# Patient Record
Sex: Female | Born: 1985 | Race: White | Hispanic: No | Marital: Married | Smoking: Current every day smoker
Health system: Southern US, Community
[De-identification: ages and names within clinical notes are randomized; demographics above are authoritative.]

## PROBLEM LIST (undated history)

## (undated) DIAGNOSIS — I10 Essential (primary) hypertension: Secondary | ICD-10-CM

---

## 2005-01-15 ENCOUNTER — Inpatient Hospital Stay: Payer: Self-pay | Admitting: Internal Medicine

## 2009-08-23 ENCOUNTER — Ambulatory Visit: Payer: Self-pay | Admitting: Family Medicine

## 2009-10-05 ENCOUNTER — Encounter: Payer: Self-pay | Admitting: Obstetrics & Gynecology

## 2009-10-05 ENCOUNTER — Observation Stay: Payer: Self-pay | Admitting: Obstetrics and Gynecology

## 2009-11-03 ENCOUNTER — Ambulatory Visit: Payer: Self-pay | Admitting: Family Medicine

## 2009-12-05 ENCOUNTER — Observation Stay: Payer: Self-pay

## 2009-12-08 ENCOUNTER — Observation Stay: Payer: Self-pay

## 2010-04-28 ENCOUNTER — Emergency Department: Payer: Self-pay | Admitting: Unknown Physician Specialty

## 2010-12-26 ENCOUNTER — Emergency Department: Payer: Self-pay | Admitting: Emergency Medicine

## 2012-05-22 ENCOUNTER — Encounter: Payer: Self-pay | Admitting: Cardiovascular Disease

## 2012-05-22 ENCOUNTER — Ambulatory Visit (INDEPENDENT_AMBULATORY_CARE_PROVIDER_SITE_OTHER): Payer: Commercial Managed Care - PPO | Admitting: Cardiovascular Disease

## 2012-05-22 VITALS — BP 130/92 | HR 69 | Ht 64.0 in | Wt 217.8 lb

## 2012-05-22 DIAGNOSIS — I10 Essential (primary) hypertension: Secondary | ICD-10-CM

## 2012-05-22 DIAGNOSIS — R0789 Other chest pain: Secondary | ICD-10-CM

## 2012-05-22 DIAGNOSIS — R609 Edema, unspecified: Secondary | ICD-10-CM

## 2012-05-22 DIAGNOSIS — R0602 Shortness of breath: Secondary | ICD-10-CM | POA: Insufficient documentation

## 2012-05-22 MED ORDER — HYDROCHLOROTHIAZIDE 25 MG PO TABS: 25.0000 mg | ORAL_TABLET | Freq: Every day | ORAL | Status: DC

## 2012-05-22 NOTE — Assessment & Plan Note (Signed)
Shortness of breath is chronic. Unable to exclude asthma as symptoms are worse with running. Certainly symptoms could be from deconditioning. Previous normal echocardiogram, low risk of underlying coronary artery disease. We have recommended that she continue her exercise. If symptoms get worse, additional workup could be performed.

## 2012-05-22 NOTE — Patient Instructions (Addendum)
You are doing well. Please try the HCTZ 1/2 or whole pill for swelling, blood pressure, SOB, high blood pressure  Try to quit smoking  The number for [facility]: Dr. Wyn Quaker and Dr. Lorretta Harp  [phone]  Please call us if you have new issues that need to be addressed before your next appt.

## 2012-05-22 NOTE — Assessment & Plan Note (Signed)
Very mild chest tightness with running likely not ischemia. If symptoms get worse, she may benefit from a trial of inhalers. Certainly could be deconditioning. Symptoms are chronic.

## 2012-05-22 NOTE — Assessment & Plan Note (Signed)
Blood pressure is mildly elevated. She does have mild edema as well. We'll start HCTZ 12.5 mg daily, titrating up to 25 mg daily as tolerated.

## 2012-05-22 NOTE — Progress Notes (Signed)
Patient ID: Heidi Nichols, female    DOB: 1986-08-14, 26 y.o.   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   She reports that her chest tightness and shortness of breath is essentially stable and has been chronic. She is concerned because she was told her echocardiogram in 2010 showed dilated heart. During her pregnancy, she had significant leg edema. Ultrasound of her legs showed no DVT and she was sent for echocardiogram at [facility]. She was told it was a problem with her echocardiogram and she was transferred to [facility] for further evaluation. After the delivery of her child, her leg edema resolved but she has continued to have significant varicosities. She reports that her father had varicose veins.   She walks 2 miles a day in an effort to lose weight. She does have mild shortness of breath if she pushes herself too quickly. Also with some chest tightness if she runs. She is able to walk, not able to run long distances. She treats her shortness of breath possibly to smoking from age [age]. She has noticed that her blood pressure has been running high. Also with some edema.  Echocardiogram from 11 2010 is essentially normal with ejection fraction greater than 65%, normal LV size and systolic function, mild LVH, essentially normal valve function, mildly elevated right ventricular systolic pressures  EKG shows normal sinus rhythm with rate 69 beats per minute, no significant ST-T wave changes   Outpatient Encounter Prescriptions as of 05/22/2012  Medication Sig Dispense Refill  . fish oil-omega-3 fatty acids 1000 MG capsule Take 2 g by mouth  daily.      . Multiple Vitamin (MULTIVITAMIN) tablet Take 1 tablet by mouth daily.        Review of Systems  Constitutional: Negative.   HENT: Negative.   Eyes: Negative.   Respiratory: Positive for chest tightness and shortness of breath.   Cardiovascular: Negative.   Gastrointestinal: Negative.   Musculoskeletal: Negative.   Skin: Negative.   Neurological: Negative.   Hematological: Negative.   Psychiatric/Behavioral: Negative.   All other systems reviewed and are negative.    BP 130/92  Pulse 69  Ht 5\' 4"  (1.626 m)  Wt 217 lb 12 oz (98.771 kg)  BMI 37.38 kg/m2  Physical Exam  Nursing note and vitals reviewed. Constitutional: She is oriented to person, place, and time. She appears well-developed and well-nourished.       Obese, numerous varicose veins noted in the lower and upper regions of her legs  HENT:  Head: Normocephalic.  Nose: Nose normal.  Mouth/Throat: Oropharynx is clear and moist.  Eyes: Conjunctivae are normal. Pupils are equal, round, and reactive to light.  Neck: Normal range of motion. Neck supple. No JVD present.  Cardiovascular: Normal rate, regular rhythm, S1 normal, S2 normal, normal heart sounds and intact distal pulses.  Exam reveals no gallop and no friction rub.   No murmur heard. Pulmonary/Chest: Effort normal and breath sounds normal. No respiratory distress. She has no wheezes. She has no rales. She exhibits no tenderness.  Abdominal: Soft. Bowel sounds are normal. She exhibits no distension. There is no tenderness.  Musculoskeletal: Normal range of motion. She  exhibits no edema and no tenderness.  Lymphadenopathy:    She has no cervical adenopathy.  Neurological: She is alert and oriented to person, place, and time. Coordination normal.  Skin: Skin is warm and dry. No rash noted. No erythema.  Psychiatric: She has a normal mood and affect. Her behavior is normal. Judgment and thought content normal.         Assessment and Plan

## 2012-05-22 NOTE — Assessment & Plan Note (Signed)
She has dependent edema, previous echo showing mildly elevated right ventricular systolic pressures though this was during pregnancy. We have recommended she take HCTZ 12.5 mg daily, possibly titrating upwards. We did mention there is a stronger diuretic echo be used if symptoms get worse. We have given her the phone number for the local vascular physicians for her varicosities.

## 2019-06-10 ENCOUNTER — Encounter: Payer: Self-pay | Admitting: Emergency Medicine

## 2019-06-10 ENCOUNTER — Emergency Department
Admission: EM | Admit: 2019-06-10 | Discharge: 2019-06-11 | Disposition: A | Discharge status: Home / Self Care | Payer: Commercial Managed Care - PPO | Attending: Emergency Medicine | Admitting: Emergency Medicine

## 2019-06-10 ENCOUNTER — Other Ambulatory Visit: Payer: Self-pay

## 2019-06-10 ENCOUNTER — Emergency Department: Payer: Commercial Managed Care - PPO

## 2019-06-10 DIAGNOSIS — N390 Urinary tract infection, site not specified: Secondary | ICD-10-CM | POA: Diagnosis not present

## 2019-06-10 DIAGNOSIS — R51 Headache: Secondary | ICD-10-CM | POA: Diagnosis not present

## 2019-06-10 DIAGNOSIS — Z79899 Other long term (current) drug therapy: Secondary | ICD-10-CM | POA: Insufficient documentation

## 2019-06-10 DIAGNOSIS — F17210 Nicotine dependence, cigarettes, uncomplicated: Secondary | ICD-10-CM | POA: Insufficient documentation

## 2019-06-10 DIAGNOSIS — I10 Essential (primary) hypertension: Secondary | ICD-10-CM | POA: Diagnosis not present

## 2019-06-10 LAB — CBC WITH DIFFERENTIAL/PLATELET
Abs Immature Granulocytes: 0.03 10*3/uL (ref 0.00–0.07)
Basophils Absolute: 0 10*3/uL (ref 0.0–0.1)
Basophils Relative: 0 %
Eosinophils Absolute: 0.1 10*3/uL (ref 0.0–0.5)
Eosinophils Relative: 2 %
HCT: 41.6 % (ref 36.0–46.0)
Hemoglobin: 14.5 g/dL (ref 12.0–15.0)
Immature Granulocytes: 0 %
Lymphocytes Relative: 28 %
Lymphs Abs: 2.1 10*3/uL (ref 0.7–4.0)
MCH: 33 pg (ref 26.0–34.0)
MCHC: 34.9 g/dL (ref 30.0–36.0)
MCV: 94.5 fL (ref 80.0–100.0)
Monocytes Absolute: 0.5 10*3/uL (ref 0.1–1.0)
Monocytes Relative: 6 %
Neutro Abs: 4.8 10*3/uL (ref 1.7–7.7)
Neutrophils Relative %: 64 %
Platelets: 158 10*3/uL (ref 150–400)
RBC: 4.4 MIL/uL (ref 3.87–5.11)
RDW: 11.7 % (ref 11.5–15.5)
WBC: 7.5 10*3/uL (ref 4.0–10.5)
nRBC: 0 % (ref 0.0–0.2)

## 2019-06-10 NOTE — ED Notes (Signed)
Patient went to [place] for BP issues. [facility] advised her to come to ED due to BP systolic being over 102. Patient states was having some left sided pressure rate 5/10 and having a headache 5/10 in pain. Denies SOB or other symptoms. Pt denies any medications for BP.

## 2019-06-10 NOTE — ED Triage Notes (Signed)
Pt sent from urgent care for HTN.   Denies pain in chest or SHOB.  Has had some blurry vision in mornings at time but denies now.  Started abx for UTI that was diagnosed with video appointment with doctor.

## 2019-06-10 NOTE — ED Provider Notes (Signed)
[facility] Emergency Department Provider Note   ____________________________________________   First MD Initiated Contact with Patient 06/10/19 2316     (approximate)  I have reviewed the triage vital signs and the nursing notes.   HISTORY  Chief Complaint Hypertension    HPI Heidi Nichols is a 33 y.o. female referred from urgent care for high blood pressure.  Patient has a history of high blood pressure who used to take HCTZ 12.5 mg several years ago. This week patient has been having lower back pain and dysuria.  Intermittent blurry vision, headache and chest discomfort.  2 days ago she had a telemedicine visit and diagnosed with UTI and started on Macrobid.  Went to urgent care today and referred to the ED secondary to high blood pressure.  Patient denies fever, cough, shortness of breath, abdominal pain, nausea, vomiting.  Denies recent trauma, travel or exposure to persons diagnosed with coronavirus.      Past medical history None  Patient Active Problem List   Diagnosis Date Noted  . Chest pressure 05/22/2012  . Shortness of breath 05/22/2012  . Edema 05/22/2012  . Hypertension 05/22/2012    History reviewed. No pertinent surgical history.  Prior to Admission medications   Medication Sig Start Date End Date Taking? Authorizing Provider  fish oil-omega-3 fatty acids 1000 MG capsule Take 2 g by mouth daily.    [provider]  Multiple Vitamin (MULTIVITAMIN) tablet Take 1 tablet by mouth daily.    [provider]  nitrofurantoin, macrocrystal-monohydrate, (MACROBID) 100 MG capsule Take 100 mg by mouth 2 (two) times a day. 06/09/19 06/13/19  [provider]    Allergies Patient has no known allergies.  Family History  Problem Relation Age of Onset  . Hypertension Mother   . Asthma Mother   . Heart attack Mother   . Fainting Paternal Grandfather     Social History Social History   Tobacco Use  .  Smoking status: Current Every Day Smoker    Packs/day: 0.50    Years: 8.00    Pack years: 4.00    Types: Cigarettes  . Smokeless tobacco: Never Used  Substance Use Topics  . Alcohol use: Yes    Alcohol/week: 2.0 standard drinks    Types: 2 Standard drinks or equivalent per week  . Drug use: No    Review of Systems  Constitutional: No fever/chills Eyes: No visual changes. ENT: No sore throat. Cardiovascular: Positive chest pain. Respiratory: Denies shortness of breath. Gastrointestinal: No abdominal pain.  No nausea, no vomiting.  No diarrhea.  No constipation. Genitourinary: Positive for dysuria. Musculoskeletal: Negative for back pain. Skin: Negative for rash. Neurological: Positive for headache. Negative for focal weakness or numbness.   ____________________________________________   PHYSICAL EXAM:  VITAL SIGNS: ED Triage Vitals  Enc Vitals Group     BP 06/10/19 1818 (!) 213/127     Pulse Rate 06/10/19 1816 92     Resp 06/10/19 1816 18     Temp 06/10/19 1816 98.6 F (37 C)     Temp Source 06/10/19 1816 Oral     SpO2 06/10/19 1816 96 %     Weight 06/10/19 1817 252 lb (114.3 kg)     Height 06/10/19 1817 5\' 4"  (1.626 m)     Head Circumference --      Peak Flow --      Pain Score 06/10/19 1816 4     Pain Loc --      Pain  Edu? --      Excl. in [place]? --     Constitutional: Alert and oriented. Well appearing and in no acute distress. Eyes: Conjunctivae are normal. PERRL. EOMI. Head: Atraumatic. Nose: No congestion/rhinnorhea. Mouth/Throat: Mucous membranes are moist.  Oropharynx non-erythematous. Neck: No stridor.  No carotid bruits.  Supple neck without meningismus. Cardiovascular: Normal rate, regular rhythm. Grossly normal heart sounds.  Good peripheral circulation. Respiratory: Normal respiratory effort.  No retractions. Lungs CTAB. Gastrointestinal: Soft and nontender. No distention. No abdominal bruits. No CVA tenderness. Musculoskeletal: No lower extremity  tenderness.  2+ BLE nonpitting edema.  No joint effusions. Neurologic:  Normal speech and language. No gross focal neurologic deficits are appreciated. No gait instability. Skin:  Skin is warm, dry and intact. No rash noted. Psychiatric: Mood and affect are normal. Speech and behavior are normal.  ____________________________________________   LABS (all labs ordered are listed, but only abnormal results are displayed)  Labs Reviewed  COMPREHENSIVE METABOLIC PANEL - Abnormal; Notable for the following components:      Result Value   Sodium 132 (*)    Glucose, Bld 105 (*)    AST 56 (*)    ALT 67 (*)    Anion gap 3 (*)    All other components within normal limits  URINALYSIS, COMPLETE (UACMP) WITH MICROSCOPIC - Abnormal; Notable for the following components:   Color, Urine YELLOW (*)    APPearance HAZY (*)    Leukocytes,Ua LARGE (*)    WBC, UA >50 (*)    Bacteria, UA RARE (*)    Non Squamous Epithelial PRESENT (*)    All other components within normal limits  URINE CULTURE  CBC WITH DIFFERENTIAL/PLATELET  TROPONIN I  POCT PREGNANCY, URINE  POC URINE PREG, ED   ____________________________________________  EKG  ED ECG REPORT I, Ekam Besson J, the attending physician, personally viewed and interpreted this ECG.   Date: 06/10/2019  EKG Time: 2324  Rate: 74  Rhythm: normal EKG, normal sinus rhythm  Axis: Normal  Intervals:none  ST&T Change: Nonspecific  ____________________________________________  RADIOLOGY  ED MD interpretation: No acute cardiopulmonary process  Official radiology report(s): Ct Head Wo Contrast  Result Date: 06/11/2019 CLINICAL DATA:  33 year old female with hypertension. Headache, blurred vision. EXAM: CT HEAD WITHOUT CONTRAST TECHNIQUE: Contiguous axial images were obtained from the base of the skull through the vertex without intravenous contrast. COMPARISON:  None. FINDINGS: Brain: Normal cerebral volume. No midline shift, ventriculomegaly, mass  effect, evidence of mass lesion, intracranial hemorrhage or evidence of cortically based acute infarction. There is a small oval hypodense area in the posterior left hemisphere white matter on series 3, image 17 and coronal image 45. I favor this is a perivascular space (normal variant). However, there is also mild heterogeneity of the right thalamus on series 3, image 13. Elsewhere gray-white matter differentiation is within normal limits throughout the brain. No cortical encephalomalacia. Vascular: No suspicious intracranial vascular hyperdensity. Skull: Negative. Sinuses/Orbits: Visualized paranasal sinuses and mastoids are clear. Other: Visualized orbits and scalp soft tissues are within normal limits. IMPRESSION: 1. Difficult to exclude small vessel disease in the brain, although small hypodense areas in the left parietal lobe white matter and the right thalamus might simply reflect perivascular spaces (normal variant). 2. Otherwise normal noncontrast head CT. Electronically Signed   By: Genevie Ann M.D.   On: 06/11/2019 00:53   Dg Chest Port 1 View  Result Date: 06/10/2019 CLINICAL DATA:  33 year old female with hypertension. Headache, left side pressure. EXAM: PORTABLE  CHEST 1 VIEW COMPARISON:  Chest radiographs 12/26/2010. FINDINGS: Portable AP upright view at 2321 hours. Lower lung volumes. Normal cardiac size and mediastinal contours. Visualized tracheal air column is within normal limits. Allowing for portable technique the lungs are clear. No osseous abnormality identified. IMPRESSION: Lower lung volumes but otherwise negative portable chest. Electronically Signed   By: Odessa FlemingH  Hall M.D.   On: 06/10/2019 23:38    ____________________________________________   PROCEDURES  Procedure(s) performed (including Critical Care):  Procedures   ____________________________________________   INITIAL IMPRESSION / ASSESSMENT AND PLAN / ED COURSE  As part of my medical decision making, I reviewed the  following data within the electronic MEDICAL RECORD NUMBER Nursing notes reviewed and incorporated, Labs reviewed, EKG interpreted, Old chart reviewed, Radiograph reviewed and Notes from prior ED visits     Heidi Nichols was evaluated in Emergency Department on 06/11/2019 for the symptoms described in the history of present illness. She was evaluated in the context of the global COVID-19 pandemic, which necessitated consideration that the patient might be at risk for infection with the SARS-CoV-2 virus that causes COVID-19. Institutional protocols and algorithms that pertain to the evaluation of patients at risk for COVID-19 are in a state of rapid change based on information released by regulatory bodies including the CDC and federal and state organizations. These policies and algorithms were followed during the patient's care in the ED.   33 year old female with history of hypertension currently not on medications who presents for elevated blood pressure in the setting of UTI.  Will check basic lab work and imaging studies.  Administer clonidine and reassess.  Clinical Course as of Jun 10 358  Fri Jun 11, 2019  0105 UA noted. Will start Rocephin and change Macrobid to Keflex.   [JS]  0224 No improvement in blood pressure after clonidine.  Will try IV hydralazine.  Updated patient on imaging results.   [JS]  0357 BP 122/94. Patient feeling better and eager for discharge home. Will restart hctz, prescribe Keflex and she will follow up closely with her PCP. Strict return precautions given. Patient verbalizes understanding and agrees with plan of care.   [JS]    Clinical Course User Index [JS] Irean HongSung, Ziyon Soltau J, MD     ____________________________________________   FINAL CLINICAL IMPRESSION(S) / ED DIAGNOSES  Final diagnoses:  Essential hypertension  Urinary tract infection without hematuria, site unspecified     ED Discharge Orders    None       Note:  This document was prepared  using Dragon voice recognition software and may include unintentional dictation errors.   Irean HongSung, Thai Burgueno J, MD 06/12/19 2330

## 2019-06-10 NOTE — ED Triage Notes (Signed)
Discussed with dr Archie Balboa. No protocols at this time.

## 2019-06-11 ENCOUNTER — Emergency Department: Payer: Commercial Managed Care - PPO

## 2019-06-11 LAB — URINALYSIS, COMPLETE (UACMP) WITH MICROSCOPIC
Bilirubin Urine: NEGATIVE
Glucose, UA: NEGATIVE mg/dL
Hgb urine dipstick: NEGATIVE
Ketones, ur: NEGATIVE mg/dL
Nitrite: NEGATIVE
Protein, ur: NEGATIVE mg/dL
Specific Gravity, Urine: 1.023 (ref 1.005–1.030)
WBC, UA: >50 WBC/hpf — ABNORMAL HIGH (ref 0–5)
pH: 5 (ref 5.0–8.0)

## 2019-06-11 LAB — COMPREHENSIVE METABOLIC PANEL
ALT: 67 U/L — ABNORMAL HIGH (ref 0–44)
AST: 56 U/L — ABNORMAL HIGH (ref 15–41)
Albumin: 3.8 g/dL (ref 3.5–5.0)
Alkaline Phosphatase: 78 U/L (ref 38–126)
Anion gap: 3 — ABNORMAL LOW (ref 5–15)
BUN: 15 mg/dL (ref 6–20)
CO2: 26 mmol/L (ref 22–32)
Calcium: 8.9 mg/dL (ref 8.9–10.3)
Chloride: 103 mmol/L (ref 98–111)
Creatinine, Ser: 0.62 mg/dL (ref 0.44–1.00)
GFR calc Af Amer: >60 mL/min (ref >60)
GFR calc non Af Amer: >60 mL/min (ref >60)
Glucose, Bld: 105 mg/dL — ABNORMAL HIGH (ref 70–99)
Potassium: 3.7 mmol/L (ref 3.5–5.1)
Sodium: 132 mmol/L — ABNORMAL LOW (ref 135–145)
Total Bilirubin: 1 mg/dL (ref 0.3–1.2)
Total Protein: 7.7 g/dL (ref 6.5–8.1)

## 2019-06-11 LAB — POCT PREGNANCY, URINE: Preg Test, Ur: NEGATIVE

## 2019-06-11 LAB — TROPONIN I: Troponin I: <0.03 ng/mL (ref <0.03)

## 2019-06-11 MED ORDER — HYDROCHLOROTHIAZIDE 25 MG PO TABS: 25.0000 mg | ORAL_TABLET | Freq: Every day | ORAL | 0 refills | Status: DC

## 2019-06-11 MED ORDER — SODIUM CHLORIDE 0.9 % IV SOLN
1.0000 g | Freq: Once | INTRAVENOUS | Status: AC
  Administered 2019-06-11: 02:00:00 1 g via INTRAVENOUS
  Filled 2019-06-11: qty 10

## 2019-06-11 MED ORDER — CEPHALEXIN 500 MG PO CAPS: 500.0000 mg | ORAL_CAPSULE | Freq: Three times a day (TID) | ORAL | 0 refills | Status: DC

## 2019-06-11 MED ORDER — CLONIDINE HCL 0.1 MG PO TABS
0.1000 mg | ORAL_TABLET | Freq: Once | ORAL | Status: AC
  Administered 2019-06-11: 0.1 mg via ORAL
  Filled 2019-06-11: qty 1

## 2019-06-11 MED ORDER — HYDRALAZINE HCL 20 MG/ML IJ SOLN
10.0000 mg | Freq: Once | INTRAMUSCULAR | Status: AC
  Administered 2019-06-11: 03:00:00 10 mg via INTRAVENOUS
  Filled 2019-06-11: qty 1

## 2019-06-11 NOTE — Discharge Instructions (Signed)
1.  Take antibiotic as prescribed (Keflex 500 mg 3 times daily x7 days). 2.  Restart HCTZ 25 mg daily. 3.  Return to the ER for worsening symptoms, persistent vomiting, difficulty breathing or other concerns.

## 2019-06-13 LAB — URINE CULTURE: Culture: >=100000 — AB

## 2020-03-03 ENCOUNTER — Other Ambulatory Visit: Payer: Self-pay

## 2020-03-03 ENCOUNTER — Emergency Department: Payer: Commercial Managed Care - PPO

## 2020-03-03 ENCOUNTER — Emergency Department
Admission: EM | Admit: 2020-03-03 | Discharge: 2020-03-03 | Disposition: A | Discharge status: Home / Self Care | Payer: Commercial Managed Care - PPO | Attending: Emergency Medicine | Admitting: Emergency Medicine

## 2020-03-03 DIAGNOSIS — I83812 Varicose veins of left lower extremities with pain: Secondary | ICD-10-CM | POA: Diagnosis not present

## 2020-03-03 DIAGNOSIS — F17210 Nicotine dependence, cigarettes, uncomplicated: Secondary | ICD-10-CM | POA: Diagnosis not present

## 2020-03-03 DIAGNOSIS — I10 Essential (primary) hypertension: Secondary | ICD-10-CM | POA: Diagnosis not present

## 2020-03-03 DIAGNOSIS — M79605 Pain in left leg: Secondary | ICD-10-CM

## 2020-03-03 DIAGNOSIS — Z79899 Other long term (current) drug therapy: Secondary | ICD-10-CM | POA: Insufficient documentation

## 2020-03-03 DIAGNOSIS — I8312 Varicose veins of left lower extremity with inflammation: Secondary | ICD-10-CM

## 2020-03-03 DIAGNOSIS — I8392 Asymptomatic varicose veins of left lower extremity: Secondary | ICD-10-CM

## 2020-03-03 DIAGNOSIS — R0789 Other chest pain: Secondary | ICD-10-CM | POA: Diagnosis present

## 2020-03-03 HISTORY — DX: Essential (primary) hypertension: I10

## 2020-03-03 LAB — CBC
HCT: 43.5 % (ref 36.0–46.0)
Hemoglobin: 15 g/dL (ref 12.0–15.0)
MCH: 33 pg (ref 26.0–34.0)
MCHC: 34.5 g/dL (ref 30.0–36.0)
MCV: 95.6 fL (ref 80.0–100.0)
Platelets: 152 10*3/uL (ref 150–400)
RBC: 4.55 MIL/uL (ref 3.87–5.11)
RDW: 11.9 % (ref 11.5–15.5)
WBC: 7.8 10*3/uL (ref 4.0–10.5)
nRBC: 0 % (ref 0.0–0.2)

## 2020-03-03 LAB — BASIC METABOLIC PANEL
Anion gap: 9 (ref 5–15)
BUN: 18 mg/dL (ref 6–20)
CO2: 26 mmol/L (ref 22–32)
Calcium: 9.7 mg/dL (ref 8.9–10.3)
Chloride: 100 mmol/L (ref 98–111)
Creatinine, Ser: 0.66 mg/dL (ref 0.44–1.00)
GFR calc Af Amer: >60 mL/min (ref >60)
GFR calc non Af Amer: >60 mL/min (ref >60)
Glucose, Bld: 124 mg/dL — ABNORMAL HIGH (ref 70–99)
Potassium: 3.6 mmol/L (ref 3.5–5.1)
Sodium: 135 mmol/L (ref 135–145)

## 2020-03-03 LAB — TROPONIN I (HIGH SENSITIVITY)
Troponin I (High Sensitivity): 9 ng/L (ref <18)
Troponin I (High Sensitivity): 8 ng/L (ref <18)

## 2020-03-03 MED ORDER — AMLODIPINE BESYLATE 10 MG PO TABS: 10.0000 mg | ORAL_TABLET | Freq: Every day | ORAL | 0 refills | Status: DC

## 2020-03-03 MED ORDER — SODIUM CHLORIDE 0.9% FLUSH: 3.0000 mL | Freq: Once | INTRAVENOUS | Status: DC

## 2020-03-03 MED ORDER — AMLODIPINE BESYLATE 5 MG PO TABS
10.0000 mg | ORAL_TABLET | Freq: Once | ORAL | Status: AC
  Administered 2020-03-03: 10 mg via ORAL
  Filled 2020-03-03: qty 2

## 2020-03-03 MED ORDER — LABETALOL HCL 5 MG/ML IV SOLN
20.0000 mg | Freq: Once | INTRAVENOUS | Status: AC
  Administered 2020-03-03: 20 mg via INTRAVENOUS
  Filled 2020-03-03: qty 4

## 2020-03-03 MED ORDER — METOPROLOL TARTRATE 25 MG PO TABS: 25.0000 mg | ORAL_TABLET | Freq: Two times a day (BID) | ORAL | 0 refills | Status: DC

## 2020-03-03 NOTE — Discharge Instructions (Addendum)
Your ultrasound confirms that the swelling on your left thigh is due to clotted varicose vein.  They do not see any clots in the large central veins of your leg.  This can be managed with a heating pad and anti-inflammatory medicine such as ibuprofen or naproxen.  Please take amlodipine and metoprolol as prescribed to help control blood pressure, and follow-up with primary care in 1 to 2 weeks for reassessment and adjustment of medications as needed.

## 2020-03-03 NOTE — ED Provider Notes (Signed)
[facility] Emergency Department Provider Note  ____________________________________________  Time seen: Approximately 5:52 PM  I have reviewed the triage vital signs and the nursing notes.   HISTORY  Chief Complaint varicose vein swelling and inflammation    HPI Heidi Nichols is a 34 y.o. 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  Patient also notes a swollen area to the left thigh.  Denies any history of DVT or trauma to the area.  Denies fevers chills body aches.      Past Medical History:  Diagnosis Date  . Hypertension      Patient Active Problem List   Diagnosis Date Noted  . Chest pressure 05/22/2012  . Shortness of breath 05/22/2012  . Edema 05/22/2012  . Hypertension 05/22/2012     History reviewed. No pertinent surgical history.   Prior to Admission medications   Medication Sig Start Date End Date Taking? Authorizing Provider  amLODipine (NORVASC) 10 MG tablet Take 1 tablet (10 mg total) by mouth daily. 03/03/20 03/03/21  Carrie Mew, MD  cephALEXin (KEFLEX) 500 MG capsule Take 1 capsule (500 mg total) by mouth 3 (three) times daily. 06/11/19   Paulette Blanch, MD  fish oil-omega-3 fatty acids 1000 MG capsule Take 2 g by mouth daily.    [provider]  hydrochlorothiazide (HYDRODIURIL) 25 MG tablet Take 1 tablet (25 mg total) by mouth daily. 06/11/19   Paulette Blanch, MD  metoprolol tartrate (LOPRESSOR) 25 MG tablet Take 1 tablet (25 mg total) by mouth 2 (two) times daily. 03/03/20 03/03/21  Carrie Mew, MD  Multiple Vitamin (MULTIVITAMIN) tablet Take 1 tablet by mouth daily.    [provider]     Allergies Patient has no known  allergies.   Family History  Problem Relation Age of Onset  . Hypertension Mother   . Asthma Mother   . Heart attack Mother   . Fainting Paternal Grandfather     Social History Social History   Tobacco Use  . Smoking status: Current Every Day Smoker    Packs/day: 0.50    Years: 8.00    Pack years: 4.00    Types: Cigarettes  . Smokeless tobacco: Never Used  Substance Use Topics  . Alcohol use: Yes    Alcohol/week: 2.0 standard drinks    Types: 2 Standard drinks or equivalent per week    Comment: social  . Drug use: No    Review of Systems  Constitutional:   No fever or chills.  ENT:   No sore throat. No rhinorrhea. Cardiovascular: Positive chest pain as above without syncope. Respiratory:   No dyspnea or cough. Gastrointestinal:   Negative for abdominal pain, vomiting and diarrhea.  Musculoskeletal:   Positive left thigh swelling as above All other systems reviewed and are negative except as documented above in ROS and HPI.  ____________________________________________   PHYSICAL EXAM:  VITAL SIGNS: ED Triage Vitals  Enc Vitals Group     BP 03/03/20 1621 (!) 232/143     Pulse Rate 03/03/20 1621 (!) 105     Resp 03/03/20 1621 18     Temp 03/03/20 1621 97.8 F (36.6 C)     Temp Source 03/03/20 1621 Oral     SpO2  03/03/20 1621 97 %     Weight 03/03/20 1626 245 lb (111.1 kg)     Height 03/03/20 1626 5\' 4"  (1.626 m)     Head Circumference --      Peak Flow --      Pain Score 03/03/20 1626 0     Pain Loc --      Pain Edu? --      Excl. in GC? --     Vital signs reviewed, nursing assessments reviewed.   Constitutional:   Alert and oriented. Non-toxic appearance. Eyes:   Conjunctivae are normal. EOMI. PERRL. ENT      Head:   Normocephalic and atraumatic.      Nose:   Wearing a mask.      Mouth/Throat:   Wearing a mask.      Neck:   No meningismus. Full ROM. Hematological/Lymphatic/Immunilogical:   No cervical lymphadenopathy. Cardiovascular:   RRR.  Symmetric bilateral radial and DP pulses.  No murmurs. Cap refill less than 2 seconds. Respiratory:   Normal respiratory effort without tachypnea/retractions. Breath sounds are clear and equal bilaterally. No wheezes/rales/rhonchi. Gastrointestinal:   Soft and nontender. Non distended. There is no CVA tenderness.  No rebound, rigidity, or guarding.  Musculoskeletal:   Normal range of motion in all extremities. No joint effusions.  No lower extremity tenderness.  No edema.  Symmetric calf circumference.  Prominent extensive varicose veins.  Left medial mid thigh has an approximately 4 cm firm nodule in the subcutaneous space which is nonmobile, not fluctuant, not tender, not warm, no streaking, no crepitus.  No wounds. Neurologic:   Normal speech and language.  Motor grossly intact. No acute focal neurologic deficits are appreciated.  Skin:    Skin is warm, dry and intact. No rash noted.  No petechiae, purpura, or bullae.  ____________________________________________    LABS (pertinent positives/negatives) (all labs ordered are listed, but only abnormal results are displayed) Labs Reviewed  BASIC METABOLIC PANEL - Abnormal; Notable for the following components:      Result Value   Glucose, Bld 124 (*)    All other components within normal limits  CBC  POC URINE PREG, ED  TROPONIN I (HIGH SENSITIVITY)  TROPONIN I (HIGH SENSITIVITY)   ____________________________________________   EKG  Interpreted by me  Date: 03/03/2020  Rate: 93  Rhythm: normal sinus rhythm  QRS Axis: normal  Intervals: normal  ST/T Wave abnormalities: normal  Conduction Disutrbances: none  Narrative Interpretation: unremarkable      ____________________________________________    RADIOLOGY  DG Chest 2 View  Result Date: 03/03/2020 CLINICAL DATA:  Chest pain. EXAM: CHEST - 2 VIEW COMPARISON:  Chest radiograph 06/10/2019, chest radiograph 12/26/2010 FINDINGS: Heart size within normal limits. There is  no airspace consolidation within the lungs. No evidence of pleural effusion or pneumothorax. No acute bony abnormality. IMPRESSION: No evidence of acute cardiopulmonary abnormality. Electronically Signed   By: 12/28/2010 DO   On: 03/03/2020 17:02   05/03/2020 Venous Img Lower Unilateral Left (DVT)  Result Date: 03/03/2020 CLINICAL DATA:  34 year old female with left leg pain and swelling. EXAM: Left LOWER EXTREMITY VENOUS DOPPLER ULTRASOUND TECHNIQUE: Gray-scale sonography with compression, as well as color and duplex ultrasound, were performed to evaluate the deep venous system(s) from the level of the common femoral vein through the popliteal and proximal calf veins. COMPARISON:  None. FINDINGS: VENOUS Normal compressibility of the common femoral, superficial femoral, and popliteal veins, as well as the visualized calf veins. Visualized portions of  profunda femoral vein and great saphenous vein unremarkable. No filling defects to suggest DVT on grayscale or color Doppler imaging. Doppler waveforms show normal direction of venous flow, normal respiratory phasicity and response to augmentation. Limited views of the contralateral common femoral vein are unremarkable. OTHER None. Limitations: There is a large thrombosed varices in the medial aspect of the distal thigh corresponding to the area of swelling and redness. IMPRESSION: 1. No femoropopliteal DVT nor evidence of DVT within the visualized calf veins. 2. Large thrombosed superficial varices. If clinical symptoms are inconsistent or if there are persistent or worsening symptoms, further imaging (possibly involving the iliac veins) may be warranted. Electronically Signed   By: Elgie Collard M.D.   On: 03/03/2020 19:03    ____________________________________________   PROCEDURES Procedures  ____________________________________________  DIFFERENTIAL DIAGNOSIS   DVT, thrombosed varicosity, lipoma, non-STEMI  CLINICAL IMPRESSION / ASSESSMENT AND PLAN /  ED COURSE  Medications ordered in the ED: Medications  sodium chloride flush (NS) 0.9 % injection 3 mL (3 mLs Intravenous Not Given 03/03/20 1732)  labetalol (NORMODYNE) injection 20 mg (20 mg Intravenous Given 03/03/20 1800)  amLODipine (NORVASC) tablet 10 mg (10 mg Oral Given 03/03/20 1928)    Pertinent labs & imaging results that were available during my care of the patient were reviewed by me and considered in my medical decision making (see chart for details).  Heidi Nichols was evaluated in Emergency Department on 03/03/2020 for the symptoms described in the history of present illness. She was evaluated in the context of the global COVID-19 pandemic, which necessitated consideration that the patient might be at risk for infection with the SARS-CoV-2 virus that causes COVID-19. Institutional protocols and algorithms that pertain to the evaluation of patients at risk for COVID-19 are in a state of rapid change based on information released by regulatory bodies including the CDC and federal and state organizations. These policies and algorithms were followed during the patient's care in the ED.   Patient presents with nonspecific chest pain in the setting of uncontrolled hypertension. Considering the patient's symptoms, medical history, and physical examination today, I have low suspicion for ACS, PE, TAD, pneumothorax, carditis, mediastinitis, pneumonia, CHF, or sepsis.  We will check serial troponins, give IV labetalol for blood pressure control.  Ultrasound left lower extremity to evaluate for DVT.  Not consistent with skin or soft tissue infection such as abscess osteomyelitis necrotizing fasciitis or cellulitis.    ----------------------------------------- 8:04 PM on 03/03/2020 -----------------------------------------  Blood pressure improved.  Serial troponins negative.  Ultrasound negative for DVT and does confirm that the area of swelling is a thrombosis of a varicose vein.  NSAIDs,  heat therapy, blood pressure management with new prescriptions for amlodipine and metoprolol, follow-up with primary care.     ____________________________________________   FINAL CLINICAL IMPRESSION(S) / ED DIAGNOSES    Final diagnoses:  Hypertension, unspecified type  Varicose veins of left lower extremity, unspecified whether complicated     ED Discharge Orders         Ordered    amLODipine (NORVASC) 10 MG tablet  Daily     03/03/20 2001    metoprolol tartrate (LOPRESSOR) 25 MG tablet  2 times daily     03/03/20 2001          Portions of this note were generated with dragon dictation software. Dictation errors may occur despite best attempts at proofreading.   Sharman Cheek, MD 03/03/20 2005

## 2020-03-03 NOTE — ED Notes (Addendum)
This RN bedside, trop drawn per EDP order. Pt resting comfortably, denies pain, denies any needs currently. Call bell within reach.

## 2020-03-03 NOTE — ED Triage Notes (Signed)
Pt to the er for pain behind the left knee and chest pain. Pt has a indurated area on the medial posterior left knee with a hx of varicose veins. No hx of DVT or blood thinners. Pt is off her HCTZ and is very hypertensive in triage.

## 2020-03-03 NOTE — ED Notes (Signed)
US at bedside at this time 

## 2020-03-03 NOTE — ED Triage Notes (Addendum)
FIRST NURSE NOTE- pt here for chest pain. Ambulatory, declined wheel chair.  NAD at this time. Also c/o varicose vein with knot in it.

## 2020-12-27 IMAGING — CT CT HEAD WITHOUT CONTRAST
3 series · 15 of 46 positions shown, 18 images · non-contrast
Comparison: None.

CLINICAL DATA: 32-year-old female with hypertension. Headache,
blurred vision.

EXAM:
CT HEAD WITHOUT CONTRAST
TECHNIQUE: Contiguous axial images were obtained from the base of the skull
through the vertex without intravenous contrast.

[Series 3: head wo · axial · 0.44mm/px · z∈[-109,+11]mm · 9 of 29 slices shown, 12 images]
[im 3/29  brain]
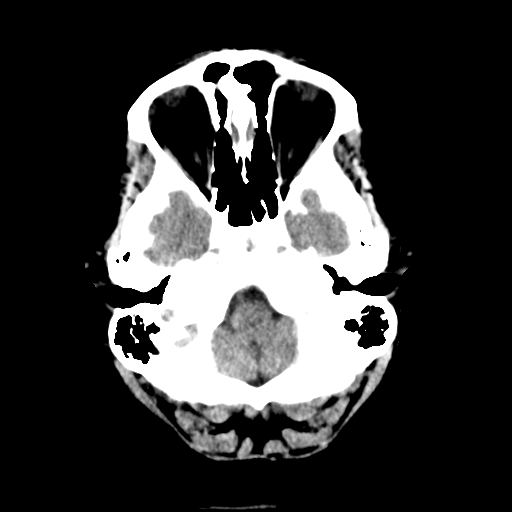
[im 3/29  bone]
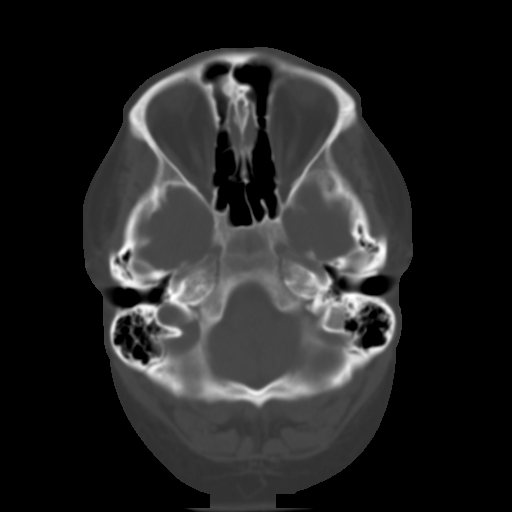
[im 6/29  brain]
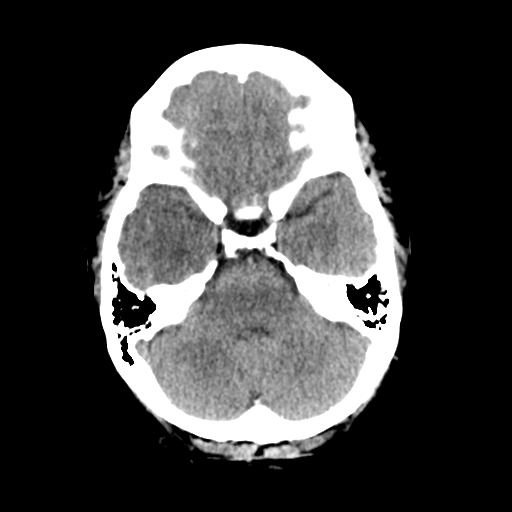
[im 9/29  brain]
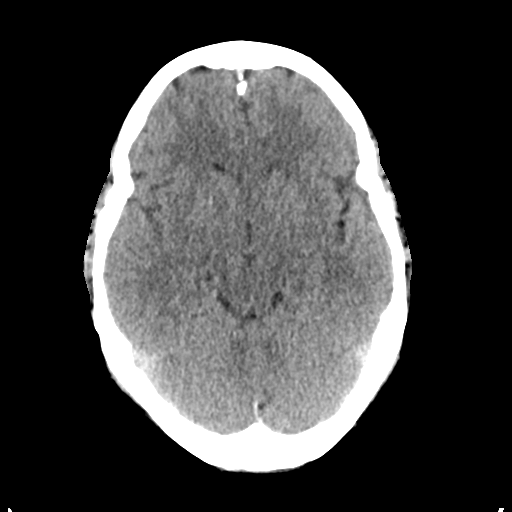
[im 12/29  brain]
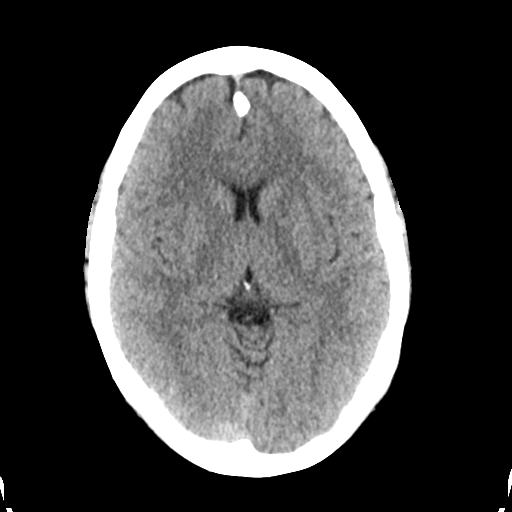
[im 15/29  brain]
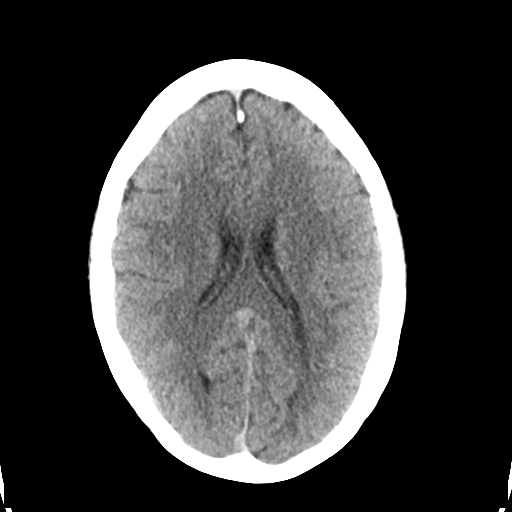
[im 15/29  bone]
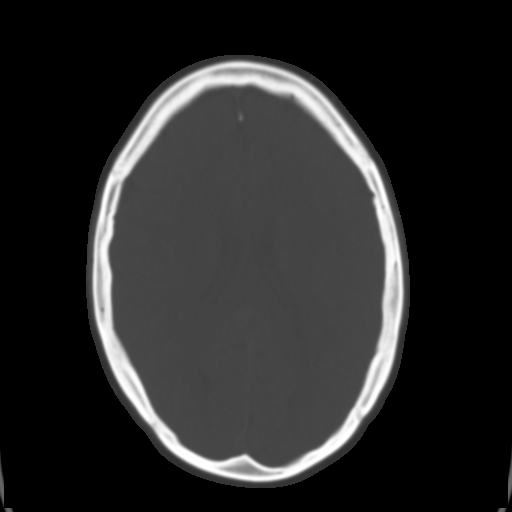
[im 18/29  brain]
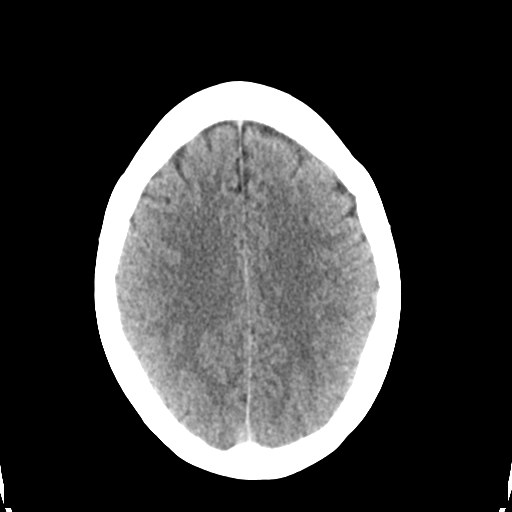
[im 21/29  brain]
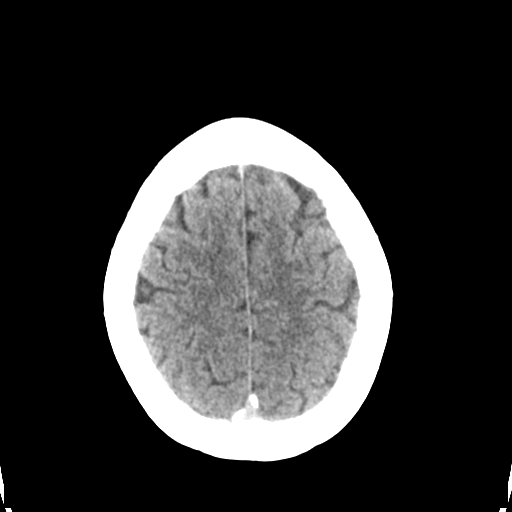
[im 24/29  brain]
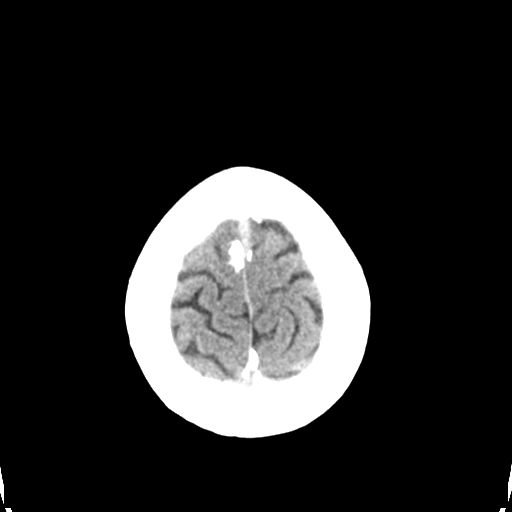
[im 27/29  brain]
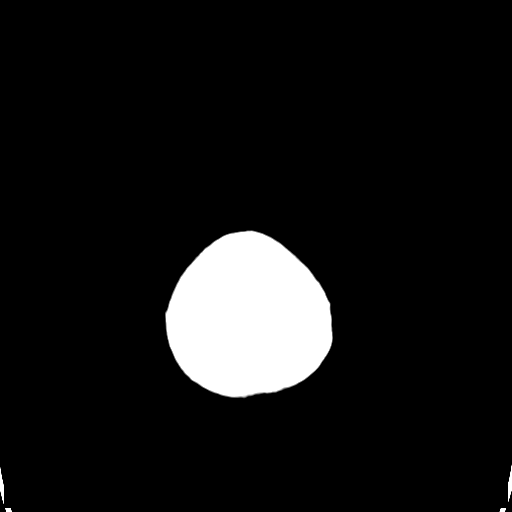
[im 27/29  bone]
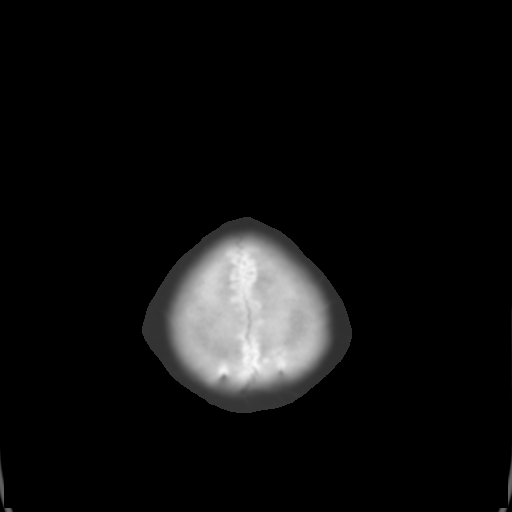

[Series 4: coronal soft tissue · coronal · 0.29mm/px · 3 of 63 slices shown]
[im 21/63  brain]
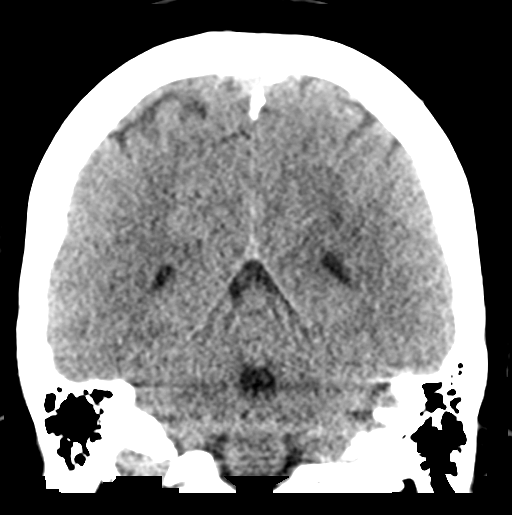
[im 28/63  brain]
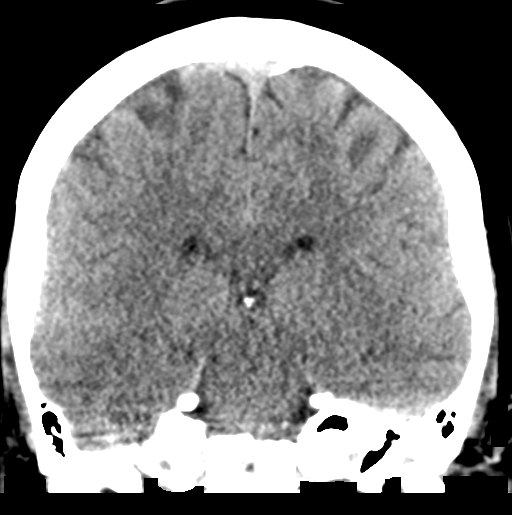
[im 35/63  brain]
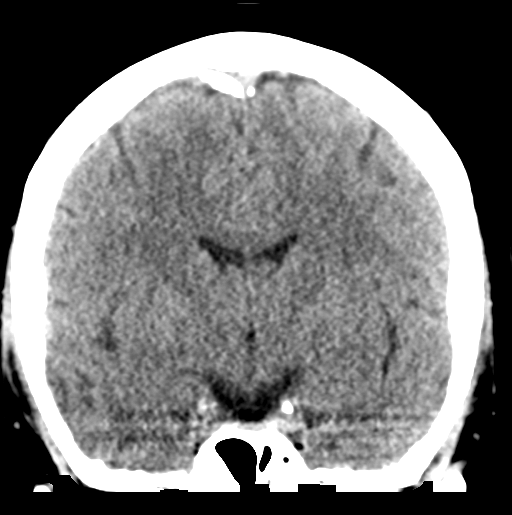

[Series 5: sagittal soft tissue · sagittal · 0.27mm/px · 3 of 47 slices shown]
[im 16/47  brain]
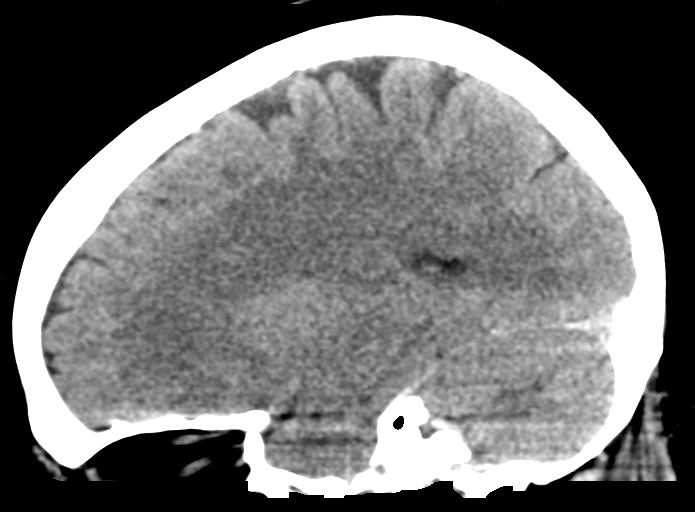
[im 24/47  brain]
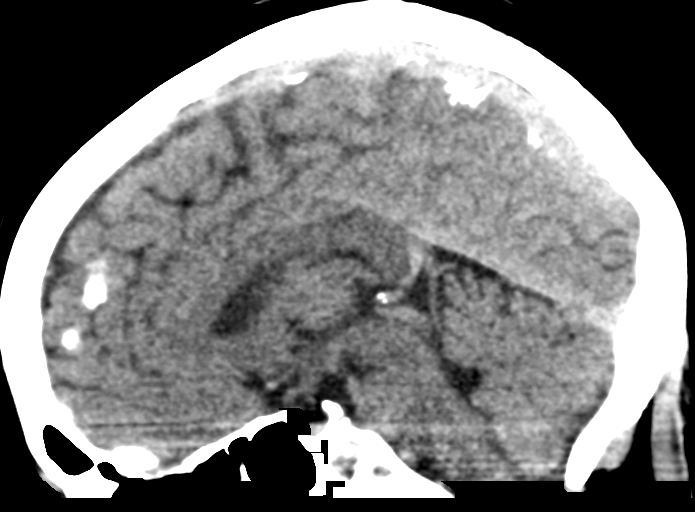
[im 31/47  brain]
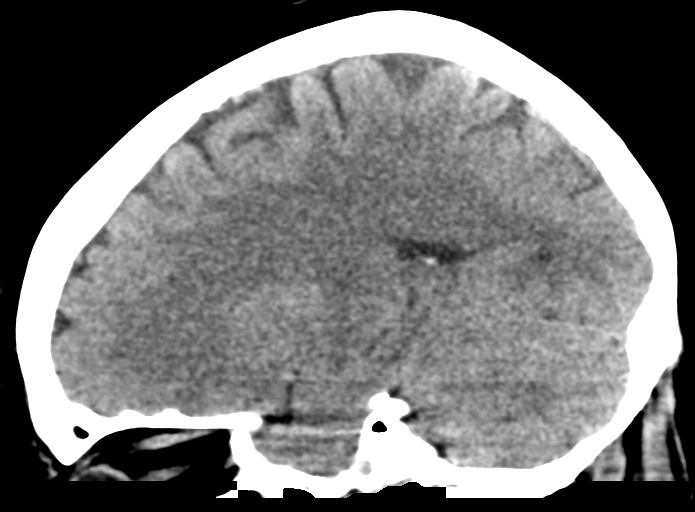

[15 of 46 positions shown; findings below may reference images not displayed]

FINDINGS: Brain: Normal cerebral volume. No midline shift, ventriculomegaly,
mass effect, evidence of mass lesion, intracranial hemorrhage or
evidence of cortically based acute infarction.

There is a small oval hypodense area in the posterior left
hemisphere white matter on series 3, image 17 and coronal image 45.
I favor this is a perivascular space (normal variant). However,
there is also mild heterogeneity of the right thalamus on series 3,
image 13. Elsewhere gray-white matter differentiation is within
normal limits throughout the brain. No cortical encephalomalacia.

Vascular: No suspicious intracranial vascular hyperdensity.

Skull: Negative.

Sinuses/Orbits: Visualized paranasal sinuses and mastoids are clear.

Other: Visualized orbits and scalp soft tissues are within normal
limits.
IMPRESSION: 1. Difficult to exclude small vessel disease in the brain, although
small hypodense areas in the left parietal lobe white matter and the
right thalamus might simply reflect perivascular spaces (normal
variant).
2. Otherwise normal noncontrast head CT.

## 2021-08-13 ENCOUNTER — Ambulatory Visit: Payer: Self-pay | Admitting: Internal Medicine

## 2021-09-19 ENCOUNTER — Other Ambulatory Visit: Payer: Self-pay

## 2021-09-19 ENCOUNTER — Ambulatory Visit (INDEPENDENT_AMBULATORY_CARE_PROVIDER_SITE_OTHER): Payer: Commercial Managed Care - PPO | Admitting: Internal Medicine

## 2021-09-19 ENCOUNTER — Encounter: Payer: Self-pay | Admitting: Internal Medicine

## 2021-09-19 VITALS — BP 187/102 | HR 81 | Temp 97.8°F | Ht 65.0 in | Wt 236.4 lb

## 2021-09-19 DIAGNOSIS — F17200 Nicotine dependence, unspecified, uncomplicated: Secondary | ICD-10-CM | POA: Diagnosis not present

## 2021-09-19 DIAGNOSIS — I10 Essential (primary) hypertension: Secondary | ICD-10-CM | POA: Diagnosis not present

## 2021-09-19 DIAGNOSIS — R8271 Bacteriuria: Secondary | ICD-10-CM

## 2021-09-19 DIAGNOSIS — F17229 Nicotine dependence, chewing tobacco, with unspecified nicotine-induced disorders: Secondary | ICD-10-CM | POA: Insufficient documentation

## 2021-09-19 DIAGNOSIS — Z23 Encounter for immunization: Secondary | ICD-10-CM

## 2021-09-19 DIAGNOSIS — I8393 Asymptomatic varicose veins of bilateral lower extremities: Secondary | ICD-10-CM | POA: Diagnosis not present

## 2021-09-19 LAB — URINALYSIS, ROUTINE W REFLEX MICROSCOPIC
Bilirubin, UA: NEGATIVE
Glucose, UA: NEGATIVE
Ketones, UA: NEGATIVE
Nitrite, UA: NEGATIVE
Protein,UA: NEGATIVE
RBC, UA: NEGATIVE
Specific Gravity, UA: 1.025 (ref 1.005–1.030)
Urobilinogen, Ur: 0.2 mg/dL (ref 0.2–1.0)
pH, UA: 5.5 (ref 5.0–7.5)

## 2021-09-19 LAB — MICROSCOPIC EXAMINATION: RBC, Urine: NONE SEEN /hpf (ref 0–2)

## 2021-09-19 IMAGING — CR DG CHEST 2V
2 series · 2 of 2 positions shown · non-contrast
Comparison: Chest radiograph 06/10/2019, chest radiograph
12/26/2010

CLINICAL DATA: Chest pain.

EXAM:
CHEST - 2 VIEW

[chest pa]
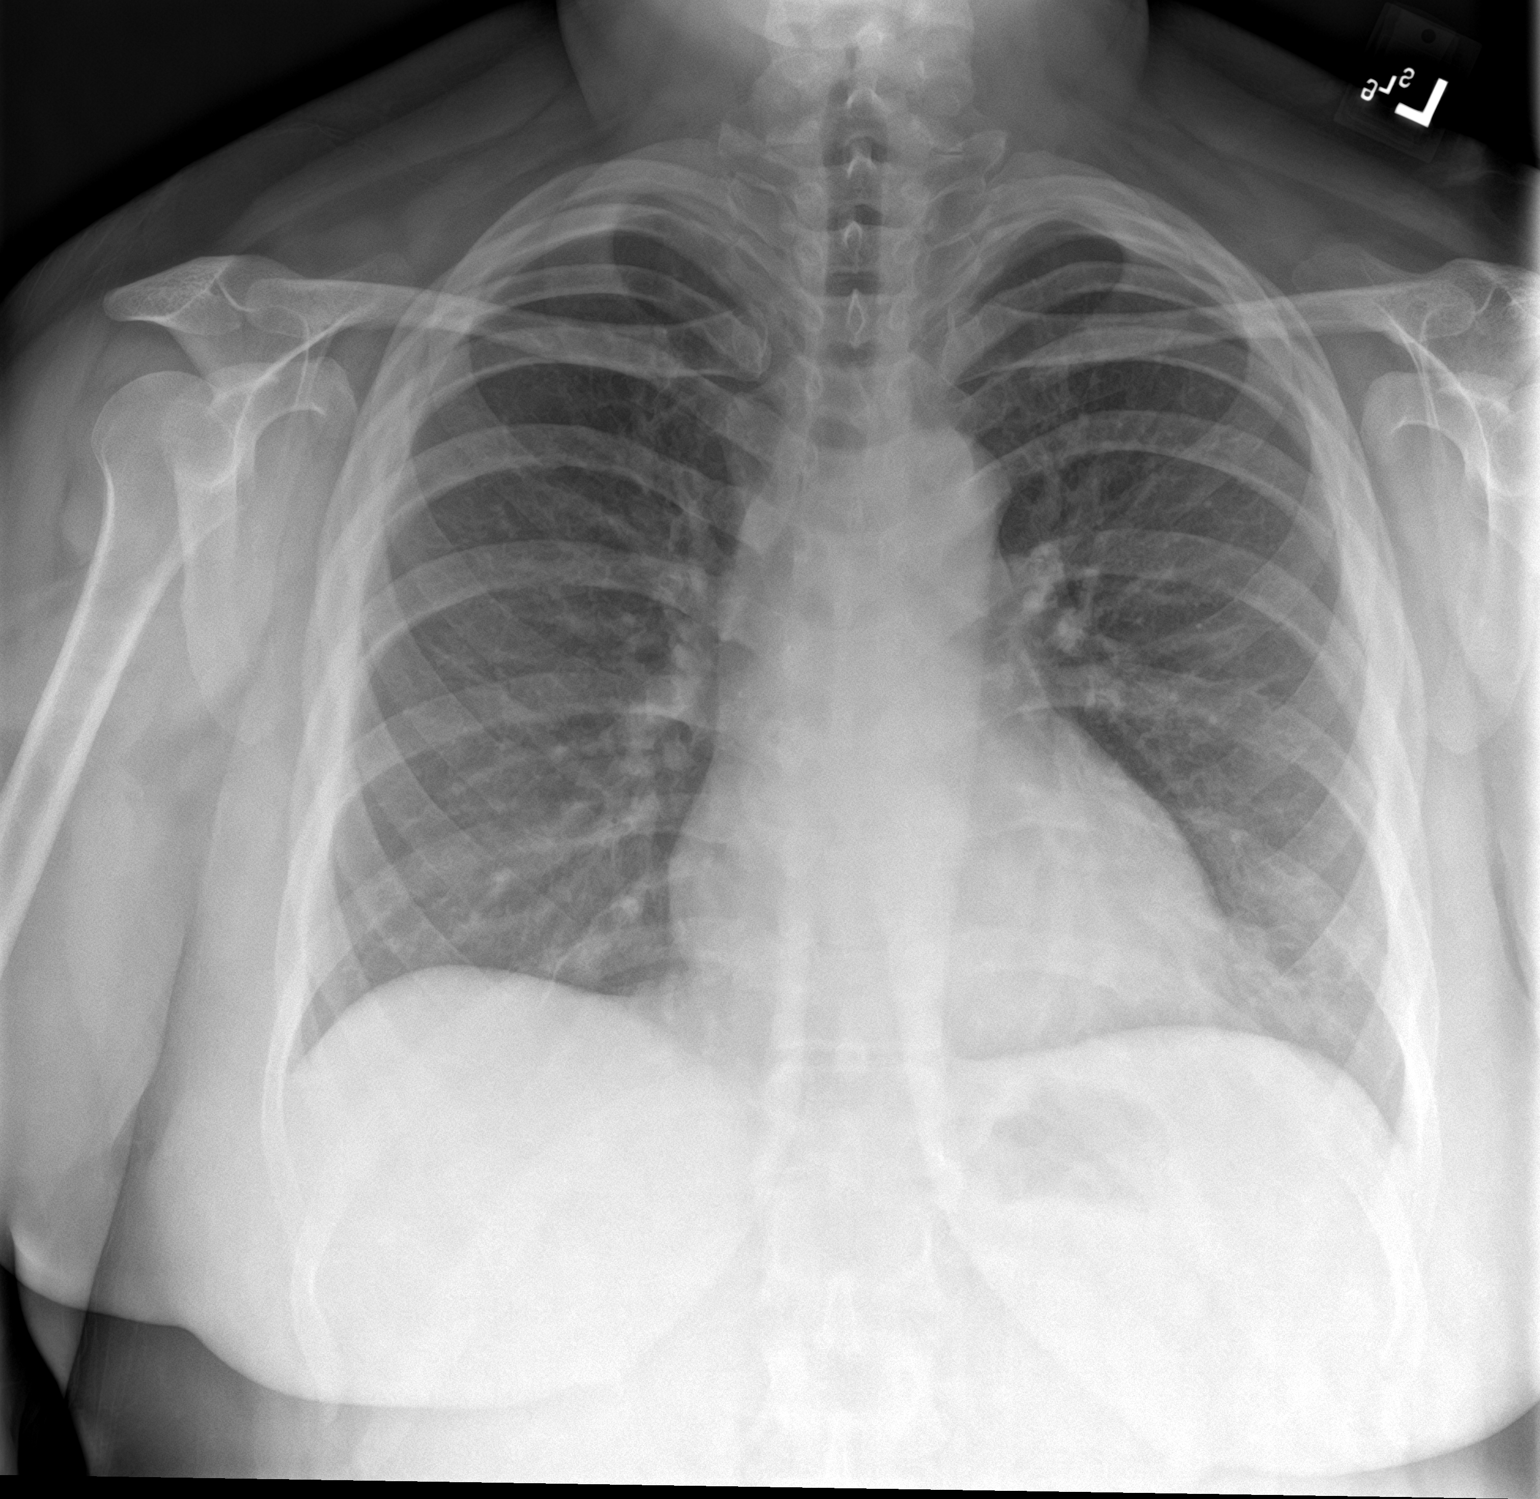

[chest lat]
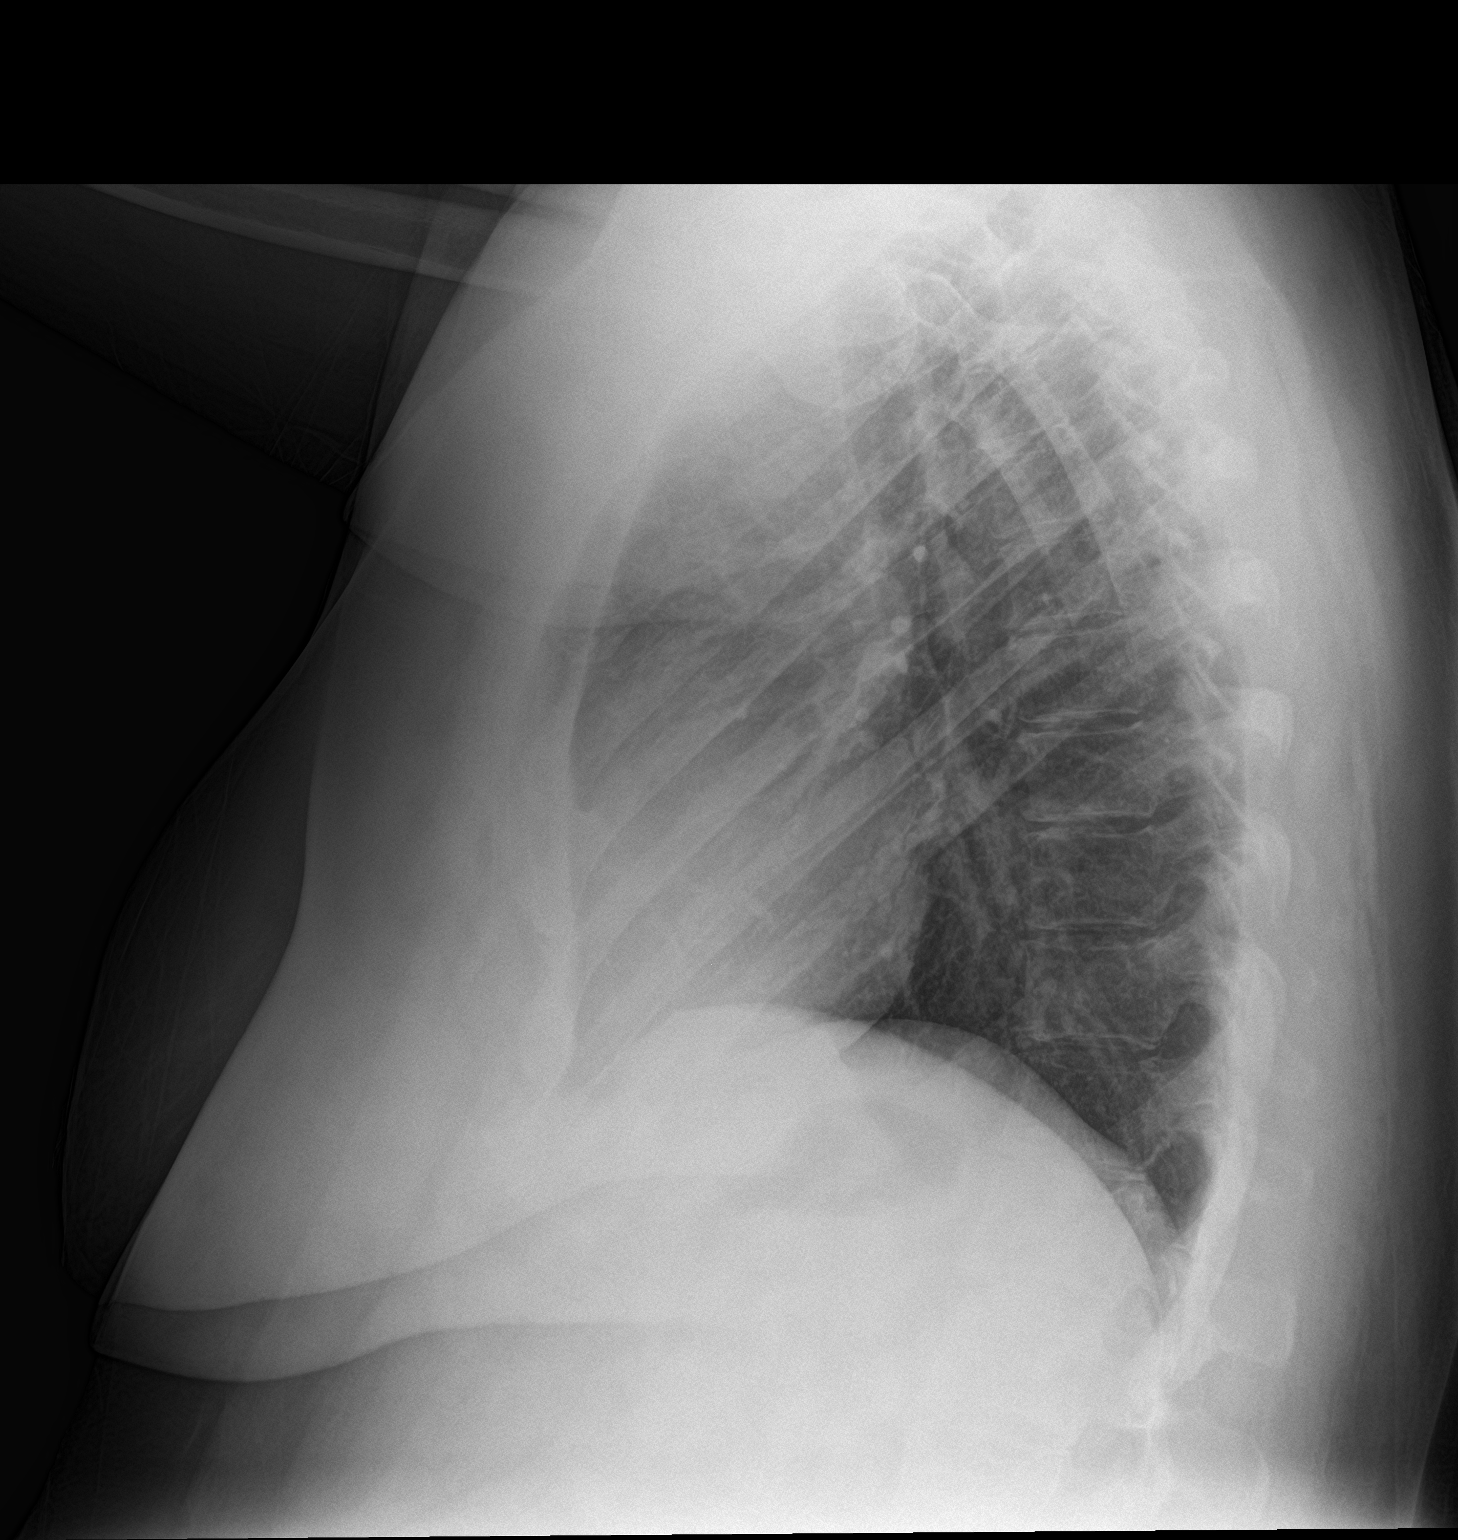

[2 of 2 positions shown; findings below may reference images not displayed]

FINDINGS: Heart size within normal limits.

There is no airspace consolidation within the lungs.

No evidence of pleural effusion or pneumothorax.

No acute bony abnormality.
IMPRESSION: No evidence of acute cardiopulmonary abnormality.

## 2021-09-19 IMAGING — US US EXTREM LOW VENOUS*L*
1 series · 13 of 24 positions shown · non-contrast
Comparison: None.

CLINICAL DATA: 33-year-old female with left leg pain and swelling.

EXAM:
Left LOWER EXTREMITY VENOUS DOPPLER ULTRASOUND
TECHNIQUE: Gray-scale sonography with compression, as well as color and duplex
ultrasound, were performed to evaluate the deep venous system(s)
from the level of the common femoral vein through the popliteal and
proximal calf veins.

[Series 1: us extrem low venous*left* · 0.07mm/px · 46 acquisitions, 13 frames shown]
[im 1/46]
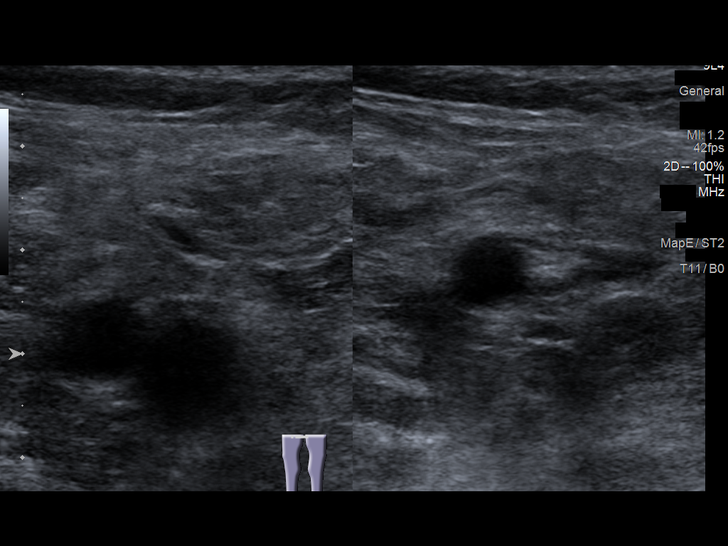
[im 4/46]
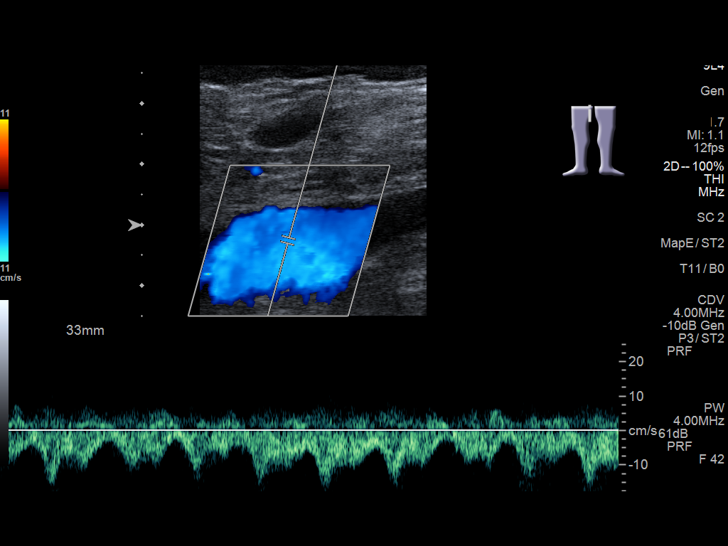
[im 8/46]
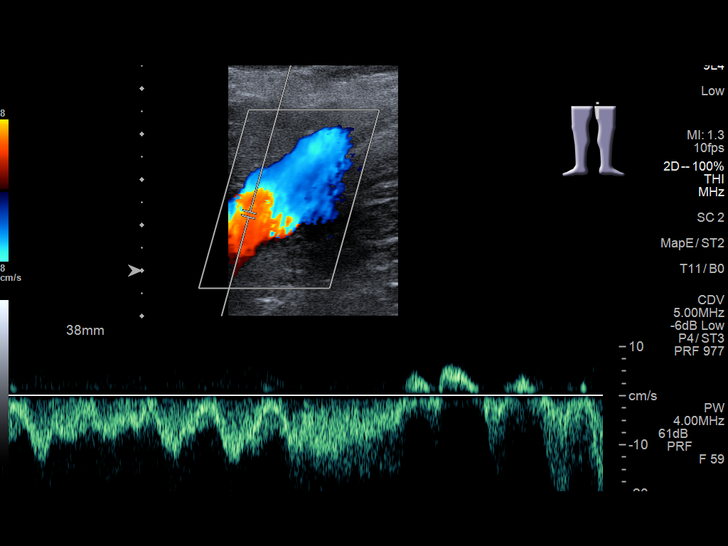
[im 12/46]
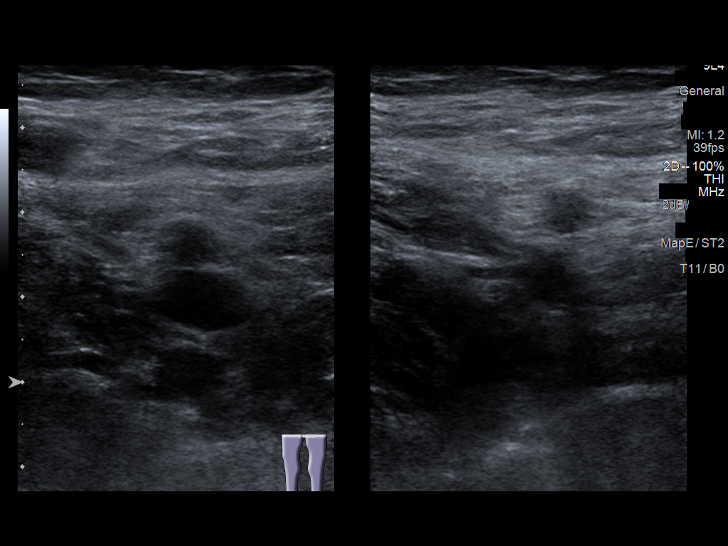
[im 16/46]
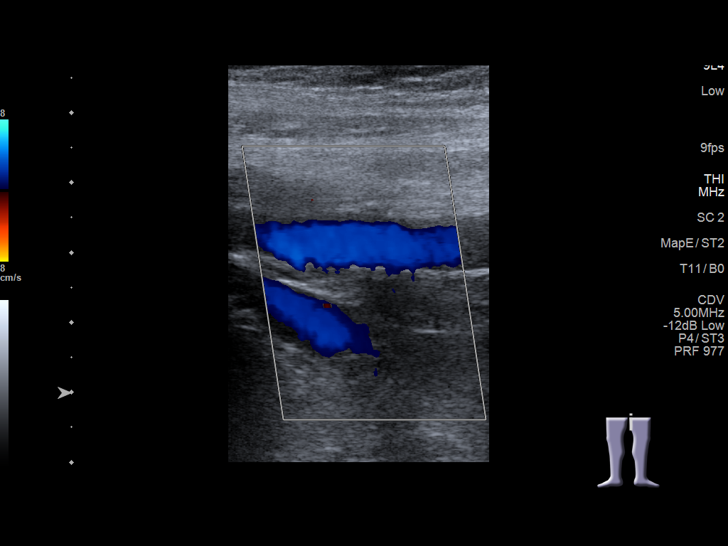
[im 20/46]
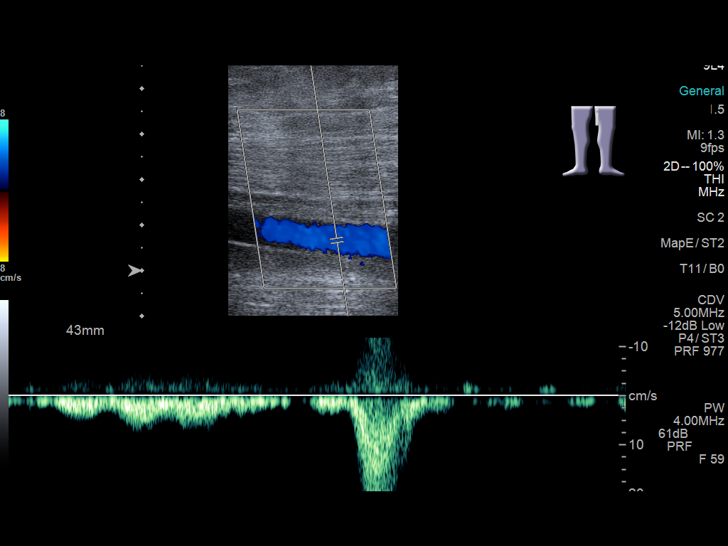
[im 26/46]
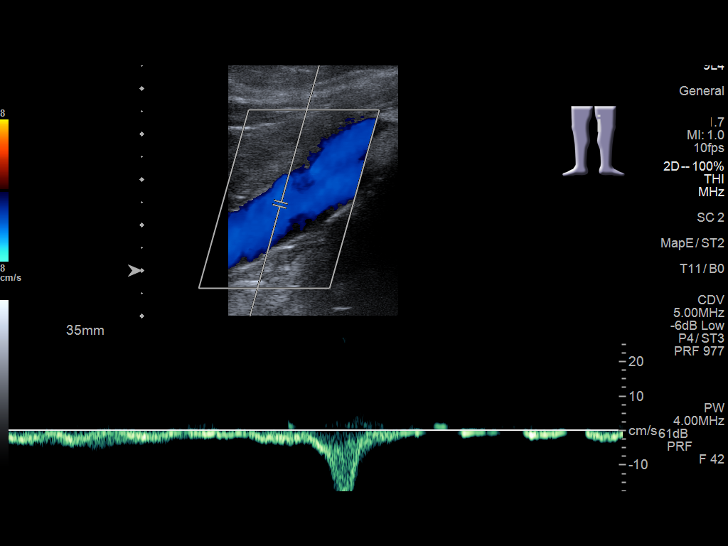
[im 28/46]
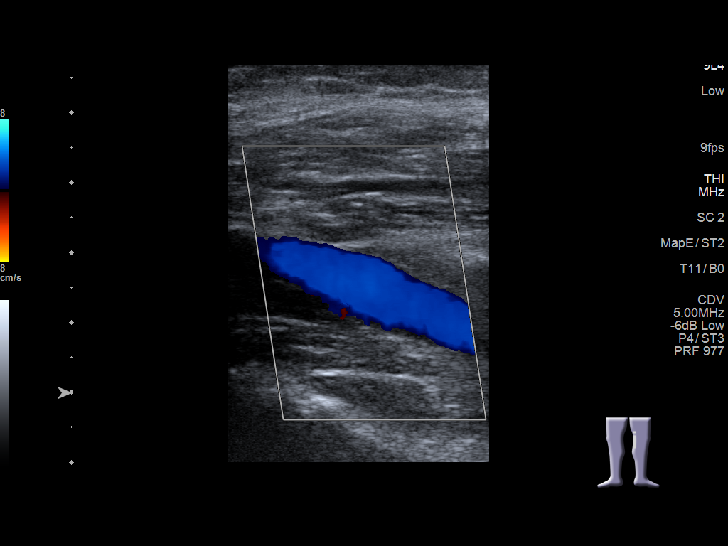
[im 32/46]
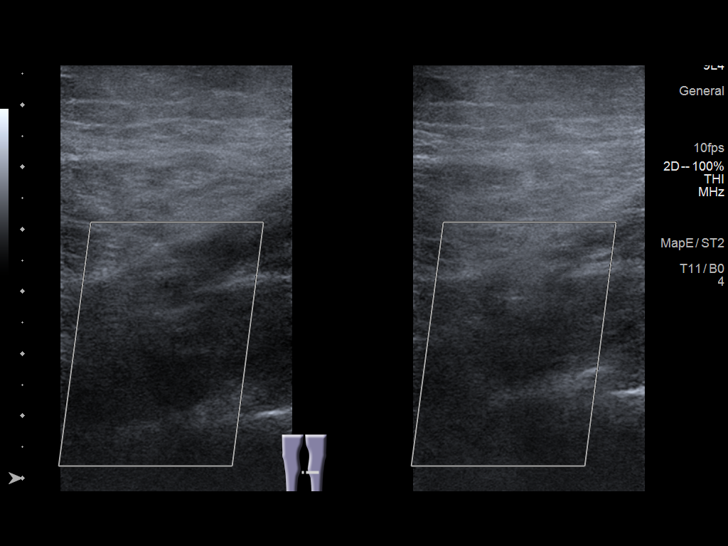
[im 36/46]
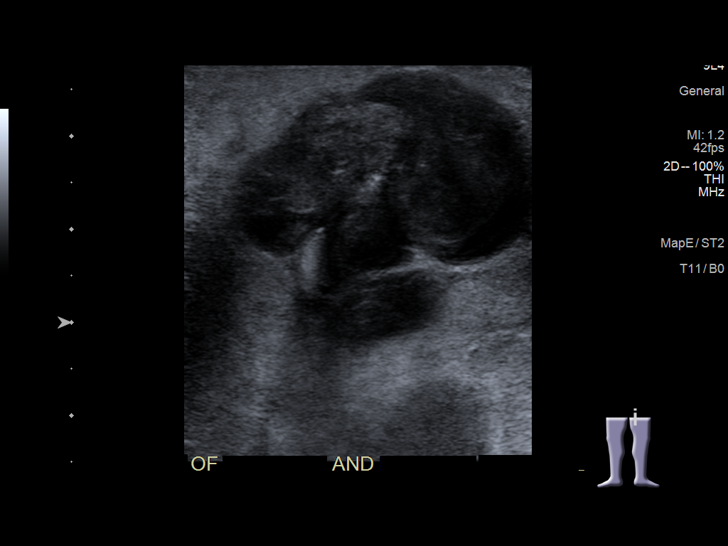
[im 40/46]
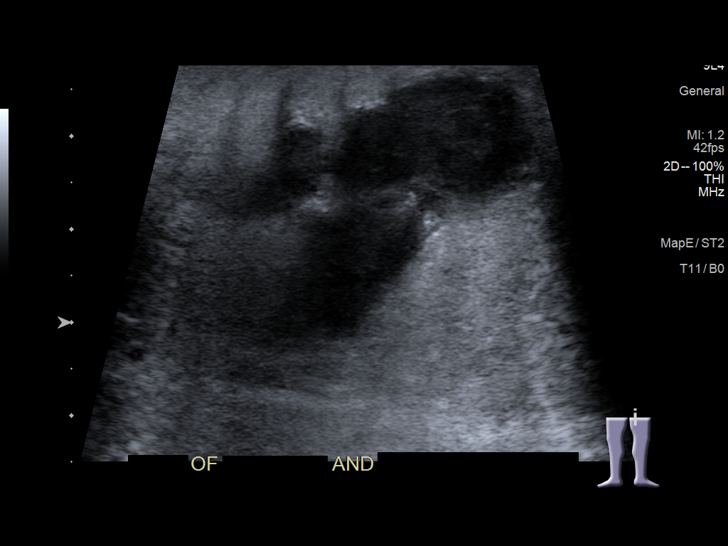
[im 44/46]
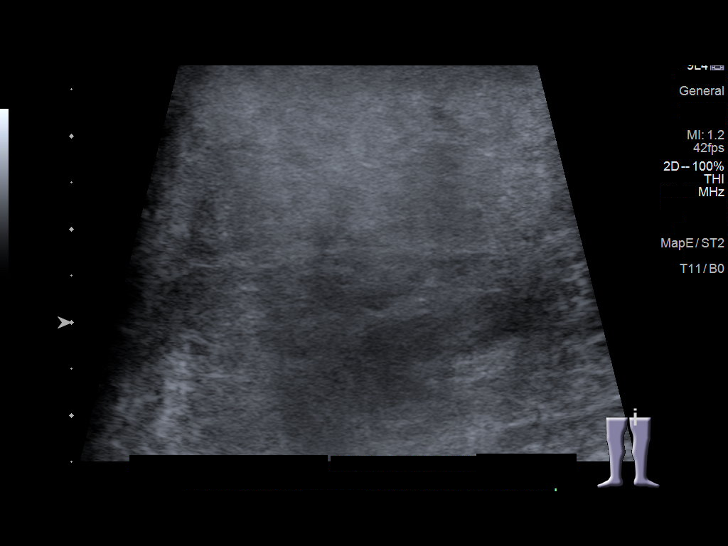
[im 46/46]
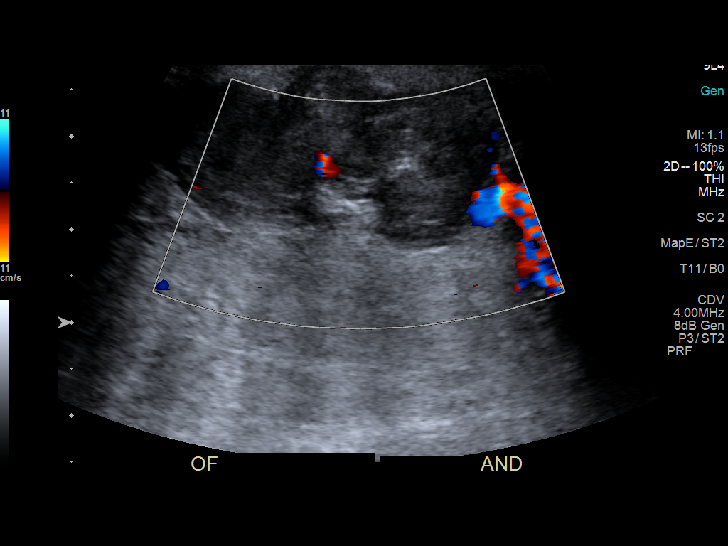

[13 of 24 positions shown; findings below may reference images not displayed]

FINDINGS: VENOUS

Normal compressibility of the common femoral, superficial femoral,
and popliteal veins, as well as the visualized calf veins.
Visualized portions of profunda femoral vein and great saphenous
vein unremarkable. No filling defects to suggest DVT on grayscale or
color Doppler imaging. Doppler waveforms show normal direction of
venous flow, normal respiratory phasicity and response to
augmentation.

Limited views of the contralateral common femoral vein are
unremarkable.

OTHER

None.

Limitations: There is a large thrombosed varices in the medial
aspect of the distal thigh corresponding to the area of swelling and
redness.
IMPRESSION: 1. No femoropopliteal DVT nor evidence of DVT within the visualized
calf veins.
2. Large thrombosed superficial varices.

If clinical symptoms are inconsistent or if there are persistent or
worsening symptoms, further imaging (possibly involving the iliac
veins) may be warranted.

## 2021-09-19 MED ORDER — LOSARTAN POTASSIUM 25 MG PO TABS: 25.0000 mg | ORAL_TABLET | Freq: Every day | ORAL | 3 refills | Status: DC

## 2021-09-19 MED ORDER — NICOTINE 21 MG/24HR TD PT24: 21.0000 mg | MEDICATED_PATCH | Freq: Every day | TRANSDERMAL | 0 refills | Status: DC

## 2021-09-19 NOTE — Progress Notes (Signed)
BP (!) 187/102 (BP Location: Left Arm, Cuff Size: Normal)   Pulse 81   Temp 97.8 F (36.6 C) (Oral)   Ht 5\' 5"  (1.651 m)   Wt 236 lb 6.4 oz (107.2 kg)   LMP 09/12/2021 (Exact Date)   SpO2 98%   BMI 39.34 kg/m    Subjective:    Patient ID: [id number], female    DOB: 1986-09-22, 35 y.o.   MRN: [id number]  Chief Complaint  Patient presents with   New Patient (Initial Visit)    HPI: Heidi Nichols is a 35 y.o. female  Pt is here to [facility] care, never seen a pcp . Has had a high bp at 187/102 mm hg. Had seen a specialist.  Is the [facility] [place].  Lost about 85 lbs diet and exercise  Smokes now to 1/4 pack a day now 1 1/2 ppd.  Hypertension This is a chronic (had seen a specialist for such .had htn during pregnancy wasnt checked after. saw a specialist - cardiology @ [facility]. went to the ER x 2 had gotten high - was placed on meds per cards x) problem. The current episode started more than 1 year ago. The problem is uncontrolled. Pertinent negatives include no anxiety, blurred vision, chest pain, headaches, malaise/fatigue, neck pain, orthopnea, palpitations, peripheral edema, PND, shortness of breath or sweats.   Chief Complaint  Patient presents with   New Patient (Initial Visit)    Relevant past medical, surgical, family and social history reviewed and updated as indicated. Interim medical history since our last visit reviewed. Allergies and medications reviewed and updated.  Review of Systems  Constitutional:  Negative for malaise/fatigue.  Eyes:  Negative for blurred vision.  Respiratory:  Negative for shortness of breath.   Cardiovascular:  Negative for chest pain, palpitations, orthopnea and PND.  Musculoskeletal:  Negative for neck pain.  Neurological:  Negative for headaches.   Per HPI unless specifically indicated above     Objective:    BP (!) 187/102 (BP Location: Left Arm, Cuff Size: Normal)   Pulse 81   Temp 97.8 F  (36.6 C) (Oral)   Ht 5\' 5"  (1.651 m)   Wt 236 lb 6.4 oz (107.2 kg)   LMP 09/12/2021 (Exact Date)   SpO2 98%   BMI 39.34 kg/m   Wt Readings from Last 3 Encounters:  09/19/21 236 lb 6.4 oz (107.2 kg)  03/03/20 245 lb (111.1 kg)  06/10/19 252 lb (114.3 kg)    Physical Exam Vitals and nursing note reviewed.  Constitutional:      General: She is not in acute distress.    Appearance: Normal appearance. She is not ill-appearing or diaphoretic.  HENT:     Head: Normocephalic and atraumatic.     Right Ear: Tympanic membrane and external ear normal. There is no impacted cerumen.     Left Ear: External ear normal.     Nose: No congestion or rhinorrhea.     Mouth/Throat:     Pharynx: No oropharyngeal exudate or posterior oropharyngeal erythema.  Eyes:     Conjunctiva/sclera: Conjunctivae normal.     Pupils: Pupils are equal, round, and reactive to light.  Cardiovascular:     Rate and Rhythm: Normal rate and regular rhythm.     Heart sounds: No murmur heard.   No friction rub. No gallop.  Pulmonary:     Effort: No respiratory distress.     Breath sounds: No stridor. No wheezing or rhonchi.  Chest:     Chest wall: No tenderness.  Abdominal:     General: Abdomen is flat. Bowel sounds are normal. There is no distension.     Palpations: Abdomen is soft. There is no mass.     Tenderness: There is no abdominal tenderness. There is no guarding.  Musculoskeletal:        General: No swelling or deformity.     Cervical back: Normal range of motion and neck supple. No rigidity or tenderness.     Right lower leg: No edema.     Left lower leg: No edema.  Skin:    General: Skin is warm and dry.     Coloration: Skin is not jaundiced.     Findings: No erythema.  Neurological:     Mental Status: She is alert and oriented to person, place, and time. Mental status is at baseline.  Psychiatric:        Mood and Affect: Mood normal.        Behavior: Behavior normal.        Thought Content:  Thought content normal.        Judgment: Judgment normal.    Results for orders placed or performed during the hospital encounter of 03/03/20  Basic metabolic panel  Result Value Ref Range   Sodium 135 135 - 145 mmol/L   Potassium 3.6 3.5 - 5.1 mmol/L   Chloride 100 98 - 111 mmol/L   CO2 26 22 - 32 mmol/L   Glucose, Bld 124 (H) 70 - 99 mg/dL   BUN 18 6 - 20 mg/dL   Creatinine, Ser 6.94 0.44 - 1.00 mg/dL   Calcium 9.7 8.9 - 85.4 mg/dL   GFR calc non Af Amer >60 >60 mL/min   GFR calc Af Amer >60 >60 mL/min   Anion gap 9 5 - 15  CBC  Result Value Ref Range   WBC 7.8 4.0 - 10.5 K/uL   RBC 4.55 3.87 - 5.11 MIL/uL   Hemoglobin 15.0 12.0 - 15.0 g/dL   HCT 62.7 03.5 - 00.9 %   MCV 95.6 80.0 - 100.0 fL   MCH 33.0 26.0 - 34.0 pg   MCHC 34.5 30.0 - 36.0 g/dL   RDW 38.1 82.9 - 93.7 %   Platelets 152 150 - 400 K/uL   nRBC 0.0 0.0 - 0.2 %  Troponin I (High Sensitivity)  Result Value Ref Range   Troponin I (High Sensitivity) 9 <18 ng/L  Troponin I (High Sensitivity)  Result Value Ref Range   Troponin I (High Sensitivity) 8 <18 ng/L       No current outpatient medications on file.    Assessment & Plan:   HTN : add losartan  CLONIDINE 0.1 MG X 1 ADMINSITERED TODAY  BP BETTER DOWN 30 POINTS Systoluic 160/ 100 mm hg after 40 mins of clonidine   Medication compliance emphasised. pt advised to keep Bp logs. Pt verbalised understanding of the same. Pt to have a low salt diet . Exercise to reach a goal of at least 150 mins a week.  lifestyle modifications explained and pt understands importance of the above.  Smoking cessation Smoking cessation advised. pt refuses chantix. failed nicotine patches in the past. continues to smoke. more than > 5 - 10 mins of time was spent with pt regarding smoking cessation and complications.   3. Bil varicose veins: chroinc, painless, worsening. Will refer to vascular surgery       Problem List Items Addressed This Visit  None Visit Diagnoses      Need for influenza vaccination    -  Primary   Relevant Orders   Flu Vaccine QUAD 50mo+IM (Fluarix, Fluzone & Alfiuria Quad PF)        Orders Placed This Encounter  Procedures   Flu Vaccine QUAD 67mo+IM (Fluarix, Fluzone & Alfiuria Quad PF)     No orders of the defined types were placed in this encounter.    Follow up plan: No follow-ups on file.  Health Maintenance :  Paps smear: Flu shot : today

## 2021-09-20 LAB — COMPREHENSIVE METABOLIC PANEL
ALT: 27 IU/L (ref 0–32)
AST: 19 IU/L (ref 0–40)
Albumin/Globulin Ratio: 1.6 (ref 1.2–2.2)
Albumin: 4.5 g/dL (ref 3.8–4.8)
Alkaline Phosphatase: 86 IU/L (ref 44–121)
BUN/Creatinine Ratio: 20 (ref 9–23)
BUN: 12 mg/dL (ref 6–20)
Bilirubin Total: 0.4 mg/dL (ref 0.0–1.2)
CO2: 24 mmol/L (ref 20–29)
Calcium: 9.4 mg/dL (ref 8.7–10.2)
Chloride: 105 mmol/L (ref 96–106)
Creatinine, Ser: 0.61 mg/dL (ref 0.57–1.00)
Globulin, Total: 2.8 g/dL (ref 1.5–4.5)
Glucose: 102 mg/dL — ABNORMAL HIGH (ref 65–99)
Potassium: 4.3 mmol/L (ref 3.5–5.2)
Sodium: 143 mmol/L (ref 134–144)
Total Protein: 7.3 g/dL (ref 6.0–8.5)
eGFR: 119 mL/min/{1.73_m2} (ref >59)

## 2021-09-20 LAB — CBC WITH DIFFERENTIAL/PLATELET
Basophils Absolute: 0 10*3/uL (ref 0.0–0.2)
Basos: 0 %
EOS (ABSOLUTE): 0.1 10*3/uL (ref 0.0–0.4)
Eos: 2 %
Hematocrit: 46.4 % (ref 34.0–46.6)
Hemoglobin: 15.7 g/dL (ref 11.1–15.9)
Immature Grans (Abs): 0 10*3/uL (ref 0.0–0.1)
Immature Granulocytes: 0 %
Lymphocytes Absolute: 1.9 10*3/uL (ref 0.7–3.1)
Lymphs: 26 %
MCH: 32.9 pg (ref 26.6–33.0)
MCHC: 33.8 g/dL (ref 31.5–35.7)
MCV: 97 fL (ref 79–97)
Monocytes Absolute: 0.5 10*3/uL (ref 0.1–0.9)
Monocytes: 7 %
Neutrophils Absolute: 4.6 10*3/uL (ref 1.4–7.0)
Neutrophils: 65 %
Platelets: 183 10*3/uL (ref 150–450)
RBC: 4.77 x10E6/uL (ref 3.77–5.28)
RDW: 11.8 % (ref 11.7–15.4)
WBC: 7.1 10*3/uL (ref 3.4–10.8)

## 2021-09-20 LAB — TSH: TSH: 2.86 u[IU]/mL (ref 0.450–4.500)

## 2021-09-22 LAB — URINE CULTURE

## 2021-10-03 ENCOUNTER — Ambulatory Visit: Payer: Commercial Managed Care - PPO | Admitting: Internal Medicine

## 2021-10-03 ENCOUNTER — Encounter: Payer: Self-pay | Admitting: Internal Medicine

## 2021-10-03 ENCOUNTER — Other Ambulatory Visit: Payer: Self-pay

## 2021-10-03 VITALS — BP 152/110 | HR 77 | Temp 98.0°F | Ht 65.0 in | Wt 236.0 lb

## 2021-10-03 DIAGNOSIS — F17200 Nicotine dependence, unspecified, uncomplicated: Secondary | ICD-10-CM

## 2021-10-03 DIAGNOSIS — E782 Mixed hyperlipidemia: Secondary | ICD-10-CM

## 2021-10-03 DIAGNOSIS — Z0000 Encounter for general adult medical examination without abnormal findings: Secondary | ICD-10-CM | POA: Diagnosis not present

## 2021-10-03 NOTE — Progress Notes (Signed)
BP (!) 152/110   Pulse 77   Temp 98 F (36.7 C) (Oral)   Ht _0  (1.651 m)   Wt 236 lb (107 kg)   LMP 09/12/2021 (Exact Date)   SpO2 100%   BMI 39.27 kg/m    Subjective:    Patient ID: Heidi Nichols, female    DOB: 1986/04/19, 35 y.o.   MRN: [id number]  Chief Complaint  Patient presents with   Hypertension   Hyperlipidemia    HPI: Heidi Nichols is a 35 y.o. female  Hypertension This is a chronic problem.  Hyperlipidemia   Chief Complaint  Patient presents with   Hypertension   Hyperlipidemia    Relevant past medical, surgical, family and social history reviewed and updated as indicated. Interim medical history since our last visit reviewed. Allergies and medications reviewed and updated.  Review of Systems  Per HPI unless specifically indicated above     Objective:    BP (!) 152/110   Pulse 77   Temp 98 F (36.7 C) (Oral)   Ht _1  (1.651 m)   Wt 236 lb (107 kg)   LMP 09/12/2021 (Exact Date)   SpO2 100%   BMI 39.27 kg/m   Wt Readings from Last 3 Encounters:  10/03/21 236 lb (107 kg)  09/19/21 236 lb 6.4 oz (107.2 kg)  03/03/20 245 lb (111.1 kg)    Physical Exam  Results for orders placed or performed in visit on 09/19/21  Microscopic Examination   Urine  Result Value Ref Range   WBC, UA 0-5 0 - 5 /hpf   RBC None seen 0 - 2 /hpf   Epithelial Cells (non renal) 0-10 0 - 10 /hpf   Mucus, UA Present (A) Not Estab.   Bacteria, UA Moderate (A) None seen/Few  Urine Culture   Specimen: Urine   UR  Result Value Ref Range   Urine Culture, Routine Final report    Organism ID, Bacteria Comment   CBC with Differential/Platelet  Result Value Ref Range   WBC 7.1 3.4 - 10.8 x10E3/uL   RBC 4.77 3.77 - 5.28 x10E6/uL   Hemoglobin 15.7 11.1 - 15.9 g/dL   Hematocrit 46.4 34.0 - 46.6 %   MCV 97 79 - 97 fL   MCH 32.9 26.6 - 33.0 pg   MCHC 33.8 31.5 - 35.7 g/dL   RDW 11.8 11.7 - 15.4 %   Platelets 183 150 - 450 x10E3/uL   Neutrophils 65  Not Estab. %   Lymphs 26 Not Estab. %   Monocytes 7 Not Estab. %   Eos 2 Not Estab. %   Basos 0 Not Estab. %   Neutrophils Absolute 4.6 1.4 - 7.0 x10E3/uL   Lymphocytes Absolute 1.9 0.7 - 3.1 x10E3/uL   Monocytes Absolute 0.5 0.1 - 0.9 x10E3/uL   EOS (ABSOLUTE) 0.1 0.0 - 0.4 x10E3/uL   Basophils Absolute 0.0 0.0 - 0.2 x10E3/uL   Immature Granulocytes 0 Not Estab. %   Immature Grans (Abs) 0.0 0.0 - 0.1 x10E3/uL  Comprehensive metabolic panel  Result Value Ref Range   Glucose 102 (H) 65 - 99 mg/dL   BUN 12 6 - 20 mg/dL   Creatinine, Ser 0.61 0.57 - 1.00 mg/dL   eGFR 119 >59 mL/min/1.73   BUN/Creatinine Ratio 20 9 - 23   Sodium 143 134 - 144 mmol/L   Potassium 4.3 3.5 - 5.2 mmol/L   Chloride 105 96 - 106 mmol/L   CO2 24 20 - 29  mmol/L   Calcium 9.4 8.7 - 10.2 mg/dL   Total Protein 7.3 6.0 - 8.5 g/dL   Albumin 4.5 3.8 - 4.8 g/dL   Globulin, Total 2.8 1.5 - 4.5 g/dL   Albumin/Globulin Ratio 1.6 1.2 - 2.2   Bilirubin Total 0.4 0.0 - 1.2 mg/dL   Alkaline Phosphatase 86 44 - 121 IU/L   AST 19 0 - 40 IU/L   ALT 27 0 - 32 IU/L  TSH  Result Value Ref Range   TSH 2.860 0.450 - 4.500 uIU/mL  Urinalysis, Routine w reflex microscopic  Result Value Ref Range   Specific Gravity, UA 1.025 1.005 - 1.030   pH, UA 5.5 5.0 - 7.5   Color, UA Yellow Yellow   Appearance Ur Cloudy (A) Clear   Leukocytes,UA Trace (A) Negative   Protein,UA Negative Negative/Trace   Glucose, UA Negative Negative   Ketones, UA Negative Negative   RBC, UA Negative Negative   Bilirubin, UA Negative Negative   Urobilinogen, Ur 0.2 0.2 - 1.0 mg/dL   Nitrite, UA Negative Negative   Microscopic Examination See below:         Current Outpatient Medications:    losartan (COZAAR) 25 MG tablet, Take 1 tablet (25 mg total) by mouth daily., Disp: 30 tablet, Rfl: 3   nicotine (NICODERM CQ) 21 mg/24hr patch, Place 1 patch (21 mg total) onto the skin daily., Disp: 28 patch, Rfl: 0    Assessment & Plan:   HTN : add  losartan 25 mg increase to 50 mg home logs - still high as well.  pt advised to keep Bp logs. Pt verbalised understanding of the same. Pt to have a low salt diet . Exercise to reach a goal of at least 150 mins a week.  lifestyle modifications explained and pt understands importance of the above.   Smoking cessation 3cigs a day now using patches  more than > 5 - 10 mins of time was spent with pt regarding smoking cessation and complications.    3. Bil varicose veins: chroinc, painless, worsening. Referral made to  vascular surgery.   PHYSICAL :  Physical Wnl will check labs   Problem List Items Addressed This Visit   None Visit Diagnoses     Annual physical exam    -  Primary   Relevant Orders   Lipid panel        Orders Placed This Encounter  Procedures   Lipid panel     No orders of the defined types were placed in this encounter.    Follow up plan: No follow-ups on file.    Labs next visit : CBC, CMP, FLP, HBA1C, TSH, PSA, urine microalbumin Labs 1 week prior to next visit.

## 2021-10-04 DIAGNOSIS — E781 Pure hyperglyceridemia: Secondary | ICD-10-CM | POA: Insufficient documentation

## 2021-10-04 DIAGNOSIS — Z0000 Encounter for general adult medical examination without abnormal findings: Secondary | ICD-10-CM | POA: Insufficient documentation

## 2021-10-04 LAB — LIPID PANEL
Chol/HDL Ratio: 5.5 ratio — ABNORMAL HIGH (ref 0.0–4.4)
Cholesterol, Total: 278 mg/dL — ABNORMAL HIGH (ref 100–199)
HDL: 51 mg/dL (ref >39)
LDL Chol Calc (NIH): 208 mg/dL — ABNORMAL HIGH (ref 0–99)
Triglycerides: 106 mg/dL (ref 0–149)
VLDL Cholesterol Cal: 19 mg/dL (ref 5–40)

## 2021-10-04 NOTE — Progress Notes (Signed)
Pl see if pt was fasting - LDL very high needs to work on diet and exercise and recheck in 6 weeks. low fat and high fiber diet recommended. Needs a fu visit in 6 weeks labs 1 week prior

## 2021-10-10 ENCOUNTER — Other Ambulatory Visit: Payer: Self-pay

## 2021-10-10 ENCOUNTER — Ambulatory Visit (INDEPENDENT_AMBULATORY_CARE_PROVIDER_SITE_OTHER): Payer: Commercial Managed Care - PPO | Admitting: Nurse Practitioner

## 2021-10-10 ENCOUNTER — Encounter: Payer: Self-pay | Admitting: Nurse Practitioner

## 2021-10-10 DIAGNOSIS — I10 Essential (primary) hypertension: Secondary | ICD-10-CM

## 2021-10-10 MED ORDER — LOSARTAN POTASSIUM 100 MG PO TABS: 100.0000 mg | ORAL_TABLET | Freq: Every day | ORAL | 4 refills | Status: DC

## 2021-10-10 NOTE — Assessment & Plan Note (Signed)
Chronic ongoing, here today for blood pressure rechecked, same initially was elevated with automatic machine 181/134 mmHg, repeat also with automatic machine was 176/127 mmHg. Patient was placed in an examination room and blood pressure repeated manually, same was 130/90 mmHg. Patient was taking losartan 50 mg that was increased from 25 mg by Dr Charlotta Newton who saw her last week and told her to return today. Losartan to increase to 100 mg, patient encouraged to continue exercising and eating a healthy diet and to try to cut down on the amount of cigarettes she smokes.

## 2021-10-10 NOTE — Patient Instructions (Signed)

## 2021-10-10 NOTE — Progress Notes (Signed)
Patient present today for BP check,  Blood pressure today was 181/134 pulse rate of 69 . Blood pressure was taken again  176/127, pulse rate of 68. Patient needs to return for follow up with Dr. Charlotta Newton to check on medications.

## 2021-10-10 NOTE — Progress Notes (Signed)
BP 130/90 (BP Location: Left Arm, Patient Position: Sitting) Comment: manual blood pressure  Pulse 68   LMP 09/12/2021 (Exact Date)    Subjective:    Patient ID: Heidi Nichols, female    DOB: [birth year], 35 y.o.   MRN: [id number]  Chief Complaint  Patient presents with   Hypertension   NOTE WRITTEN BY [employer] DNP STUDENT.  ASSESSMENT AND PLAN OF CARE REVIEWED WITH STUDENT, AGREE WITH ABOVE FINDINGS AND PLAN.   HPI: Heidi Nichols is a 35 y.o. female, here today for a blood pressure recheck. Saw Dr. Charlotta Newton last week for elevated blood pressure. Stated that she had blood pressure issues during pregnancy 12 years ago. She was not following up as she should and recently started seeing a cardiologist who prescribed losartan 25 mg. Losartan was increased to 50 mg, and patient told to return today for follow up. Blood pressures in office were 181/134 initially and repeat 179/127 mmHg with automatic monitor. Blood pressure was repeated using manual blood pressure machine, same was 130/90 mmHg.  HYPERTENSION Hypertension status: uncontrolled  Satisfied with current treatment? yes Duration of hypertension: chronic BP monitoring frequency:  a few times a week BP range:  BP medication side effects:  no Medication compliance: good compliance Previous BP meds:losartan (cozaar) Aspirin: no Recurrent headaches: no Visual changes: no Palpitations: no Dyspnea: no Chest pain: no Lower extremity edema: no Dizzy/lightheaded: no    Relevant past medical, surgical, family and social history reviewed and updated as indicated. Interim medical history since our last visit reviewed. Allergies and medications reviewed and updated.  Review of Systems  Eyes:  Negative for visual disturbance.  Respiratory:  Negative for chest tightness and shortness of breath.   Cardiovascular:  Negative for chest pain and palpitations.  Neurological:  Negative for dizziness and light-headedness.   Per HPI  unless specifically indicated above     Objective:    BP 130/90 (BP Location: Left Arm, Patient Position: Sitting) Comment: manual blood pressure  Pulse 68   LMP 09/12/2021 (Exact Date)   Wt Readings from Last 3 Encounters:  10/03/21 236 lb (107 kg)  09/19/21 236 lb 6.4 oz (107.2 kg)  03/03/20 245 lb (111.1 kg)    Physical Exam Vitals and nursing note reviewed.  Constitutional:      General: She is not in acute distress.    Appearance: Normal appearance. She is obese. She is not ill-appearing or toxic-appearing.  HENT:     Head: Normocephalic.  Eyes:     General: Lids are normal.  Cardiovascular:     Rate and Rhythm: Normal rate and regular rhythm.     Heart sounds: Normal heart sounds, S1 normal and S2 normal.  Pulmonary:     Effort: Pulmonary effort is normal.     Breath sounds: Normal breath sounds and air entry.  Abdominal:     General: Bowel sounds are normal. There is no distension.     Palpations: Abdomen is soft.     Tenderness: There is no abdominal tenderness.  Neurological:     Mental Status: She is alert and oriented to person, place, and time.  Psychiatric:        Attention and Perception: Attention normal.        Mood and Affect: Mood normal.        Speech: Speech normal.        Behavior: Behavior normal. Behavior is cooperative.    Results for orders placed or performed in visit on 10/03/21  Lipid panel  Result Value Ref Range   Cholesterol, Total 278 (H) 100 - 199 mg/dL   Triglycerides 681 0 - 149 mg/dL   HDL 51 >27 mg/dL   VLDL Cholesterol Cal 19 5 - 40 mg/dL   LDL Chol Calc (NIH) 517 (H) 0 - 99 mg/dL   Chol/HDL Ratio 5.5 (H) 0.0 - 4.4 ratio      Assessment & Plan:   Problem List Items Addressed This Visit       Cardiovascular and Mediastinum   Hypertension    Chronic ongoing, here today for blood pressure rechecked, same initially was elevated with automatic machine 181/134 mmHg, repeat also with automatic machine was 176/127 mmHg. Patient  was placed in an examination room and blood pressure repeated manually, same was 130/90 mmHg. Patient was taking losartan 50 mg that was increased from 25 mg by Dr Charlotta Newton who saw her last week and told her to return today. Losartan to increase to 100 mg, patient encouraged to continue exercising and eating a healthy diet and to try to cut down on the amount of cigarettes she smokes.      Relevant Medications   losartan (COZAAR) 100 MG tablet     Follow up plan: Return for as scheduled with Dr. Charlotta Newton.

## 2021-10-26 ENCOUNTER — Encounter (INDEPENDENT_AMBULATORY_CARE_PROVIDER_SITE_OTHER): Payer: Self-pay | Admitting: Nurse Practitioner

## 2021-10-26 ENCOUNTER — Other Ambulatory Visit: Payer: Self-pay

## 2021-10-26 ENCOUNTER — Ambulatory Visit (INDEPENDENT_AMBULATORY_CARE_PROVIDER_SITE_OTHER): Payer: Commercial Managed Care - PPO | Admitting: Nurse Practitioner

## 2021-10-26 VITALS — BP 169/118 | HR 79 | Resp 16 | Ht 64.0 in | Wt 237.0 lb

## 2021-10-26 DIAGNOSIS — E781 Pure hyperglyceridemia: Secondary | ICD-10-CM

## 2021-10-26 DIAGNOSIS — I83813 Varicose veins of bilateral lower extremities with pain: Secondary | ICD-10-CM | POA: Diagnosis not present

## 2021-10-26 DIAGNOSIS — I10 Essential (primary) hypertension: Secondary | ICD-10-CM | POA: Diagnosis not present

## 2021-10-27 NOTE — Progress Notes (Signed)
Subjective:    Patient ID: Heidi Nichols, female    DOB: Mar 11, 1986, 35 y.o.   MRN: [id number] Chief Complaint  Patient presents with   New Patient (Initial Visit)    Ref Vigg for varicose veins     Heidi Nichols is a 35 year old female that presents today from her primary care provider Dr. Charlotta Newton for concerns of varicose veins.The patient is seen for evaluation of symptomatic varicose veins. The patient relates burning and stinging which worsened steadily throughout the course of the day, particularly with standing. The patient also notes an aching and throbbing pain over the varicosities, particularly with prolonged dependent positions. The symptoms are significantly improved with elevation.  The patient also notes that during hot weather the symptoms are greatly intensified. The patient states the pain from the varicose veins interferes with work, daily exercise, shopping and household maintenance. At this point, the symptoms are persistent and severe enough that they're having a negative impact on lifestyle and are interfering with daily activities.  There is no history of DVT, PE or superficial thrombophlebitis. There is no history of ulceration or hemorrhage. The patient endorses a significant family history of varicose veins.   The patient has worn graduated compression in the past. At the present time the patient has been using over-the-counter analgesics. There is no history of prior surgical intervention or sclerotherapy.      Review of Systems  Cardiovascular:  Positive for leg swelling.  All other systems reviewed and are negative.     Objective:   Physical Exam Vitals reviewed.  HENT:     Head: Normocephalic.  Cardiovascular:     Rate and Rhythm: Normal rate and regular rhythm.     Pulses: Normal pulses.  Pulmonary:     Effort: Pulmonary effort is normal.  Skin:    General: Skin is warm and dry.     Comments: Large varicosities bilaterally  Neurological:      Mental Status: She is alert and oriented to person, place, and time.  Psychiatric:        Mood and Affect: Mood normal.        Behavior: Behavior normal.        Thought Content: Thought content normal.        Judgment: Judgment normal.    BP (!) 169/118 (BP Location: Right Arm)   Pulse 79   Resp 16   Ht 5\' 4"  (1.626 m)   Wt 237 lb (107.5 kg)   BMI 40.68 kg/m   Past Medical History:  Diagnosis Date   Hypertension     Social History   Socioeconomic History   Marital status: Married    Spouse name: Not on file   Number of children: Not on file   Years of education: Not on file   Highest education level: Not on file  Occupational History   Not on file  Tobacco Use   Smoking status: Every Day    Packs/day: 0.50    Years: 8.00    Pack years: 4.00    Types: Cigarettes   Smokeless tobacco: Never  Vaping Use   Vaping Use: Some days  Substance and Sexual Activity   Alcohol use: Yes    Alcohol/week: 2.0 standard drinks    Types: 2 Standard drinks or equivalent per week    Comment: social   Drug use: No   Sexual activity: Yes    Birth control/protection: None  Other Topics Concern   Not on file  Social  History Narrative   Not on file   Social Determinants of Health   Financial Resource Strain: Not on file  Food Insecurity: Not on file  Transportation Needs: Not on file  Physical Activity: Not on file  Stress: Not on file  Social Connections: Not on file  Intimate Partner Violence: Not on file    History reviewed. No pertinent surgical history.  Family History  Problem Relation Age of Onset   COPD Mother    Hypertension Mother    Asthma Mother    Heart attack Mother    Prostate cancer Father    COPD Maternal Grandmother    Heart attack Paternal Grandfather     No Known Allergies  CBC Latest Ref Rng & Units 09/19/2021 03/03/2020 06/10/2019  WBC 3.4 - 10.8 x10E3/uL 7.1 7.8 7.5  Hemoglobin 11.1 - 15.9 g/dL 50.5 39.7 67.3  Hematocrit 34.0 - 46.6 %  46.4 43.5 41.6  Platelets 150 - 450 x10E3/uL 183 152 158      CMP     Component Value Date/Time   NA 143 09/19/2021 0950   K 4.3 09/19/2021 0950   CL 105 09/19/2021 0950   CO2 24 09/19/2021 0950   GLUCOSE 102 (H) 09/19/2021 0950   GLUCOSE 124 (H) 03/03/2020 1629   BUN 12 09/19/2021 0950   CREATININE 0.61 09/19/2021 0950   CALCIUM 9.4 09/19/2021 0950   PROT 7.3 09/19/2021 0950   ALBUMIN 4.5 09/19/2021 0950   AST 19 09/19/2021 0950   ALT 27 09/19/2021 0950   ALKPHOS 86 09/19/2021 0950   BILITOT 0.4 09/19/2021 0950   GFRNONAA >60 03/03/2020 1629   GFRAA >60 03/03/2020 1629     No results found.     Assessment & Plan:   1. Varicose veins of both lower extremities with pain  Recommend:  The patient has large symptomatic varicose veins that are painful and associated with swelling.  I have had a long discussion with the patient regarding  varicose veins and why they cause symptoms.  Patient will continue wearing graduated compression stockings class 1 on a daily basis, beginning first thing in the morning and removing them in the evening. The patient is instructed specifically not to sleep in the stockings.    The patient  will also continue using over-the-counter analgesics such as Motrin 600 mg po TID to help control the symptoms.    In addition, behavioral modification including elevation during the day will be continued.    An  ultrasound of the venous system will be obtained.   Further plans will be based on the ultrasound results and whether conservative therapies are successful at eliminating the pain and swelling.    2. Primary hypertension Continue antihypertensive medications as already ordered, these medications have been reviewed and there are no changes at this time.   3. Pure hypertriglyceridemia Continue statin as ordered and reviewed, no changes at this time    Current Outpatient Medications on File Prior to Visit  Medication Sig Dispense Refill    losartan (COZAAR) 100 MG tablet Take 1 tablet (100 mg total) by mouth daily. 90 tablet 4   nicotine (NICODERM CQ) 21 mg/24hr patch Place 1 patch (21 mg total) onto the skin daily. 28 patch 0   No current facility-administered medications on file prior to visit.    There are no Patient Instructions on file for this visit. No follow-ups on file.   Georgiana Spinner, NP

## 2021-10-28 ENCOUNTER — Encounter (INDEPENDENT_AMBULATORY_CARE_PROVIDER_SITE_OTHER): Payer: Self-pay | Admitting: Nurse Practitioner

## 2021-10-31 ENCOUNTER — Other Ambulatory Visit (INDEPENDENT_AMBULATORY_CARE_PROVIDER_SITE_OTHER): Payer: Self-pay | Admitting: Nurse Practitioner

## 2021-10-31 DIAGNOSIS — I83813 Varicose veins of bilateral lower extremities with pain: Secondary | ICD-10-CM

## 2021-11-01 ENCOUNTER — Ambulatory Visit (INDEPENDENT_AMBULATORY_CARE_PROVIDER_SITE_OTHER): Payer: Commercial Managed Care - PPO

## 2021-11-01 ENCOUNTER — Other Ambulatory Visit: Payer: Self-pay

## 2021-11-01 ENCOUNTER — Encounter (INDEPENDENT_AMBULATORY_CARE_PROVIDER_SITE_OTHER): Payer: Self-pay

## 2021-11-01 ENCOUNTER — Ambulatory Visit (INDEPENDENT_AMBULATORY_CARE_PROVIDER_SITE_OTHER): Payer: Commercial Managed Care - PPO | Admitting: Nurse Practitioner

## 2021-11-01 DIAGNOSIS — I83813 Varicose veins of bilateral lower extremities with pain: Secondary | ICD-10-CM | POA: Diagnosis not present

## 2021-11-07 ENCOUNTER — Encounter: Payer: Commercial Managed Care - PPO | Admitting: Obstetrics and Gynecology

## 2021-11-07 ENCOUNTER — Encounter: Payer: Self-pay | Admitting: Obstetrics and Gynecology

## 2021-11-07 DIAGNOSIS — Z124 Encounter for screening for malignant neoplasm of cervix: Secondary | ICD-10-CM

## 2021-11-07 DIAGNOSIS — Z01419 Encounter for gynecological examination (general) (routine) without abnormal findings: Secondary | ICD-10-CM

## 2021-11-14 ENCOUNTER — Other Ambulatory Visit: Payer: Self-pay

## 2021-11-14 ENCOUNTER — Encounter: Payer: Self-pay | Admitting: Internal Medicine

## 2021-11-14 ENCOUNTER — Ambulatory Visit: Payer: Commercial Managed Care - PPO | Admitting: Internal Medicine

## 2021-11-14 VITALS — BP 122/86 | HR 79 | Temp 98.4°F | Ht 64.17 in | Wt 236.2 lb

## 2021-11-14 DIAGNOSIS — F1010 Alcohol abuse, uncomplicated: Secondary | ICD-10-CM | POA: Diagnosis not present

## 2021-11-14 DIAGNOSIS — I10 Essential (primary) hypertension: Secondary | ICD-10-CM | POA: Diagnosis not present

## 2021-11-14 DIAGNOSIS — R600 Localized edema: Secondary | ICD-10-CM

## 2021-11-14 MED ORDER — AMLODIPINE BESYLATE 5 MG PO TABS: 5.0000 mg | ORAL_TABLET | Freq: Every day | ORAL | 3 refills | Status: DC

## 2021-11-14 MED ORDER — HYDROCHLOROTHIAZIDE 12.5 MG PO TABS: 12.5000 mg | ORAL_TABLET | Freq: Every day | ORAL | 3 refills | Status: DC

## 2021-11-14 MED ORDER — CLONIDINE HCL 0.1 MG PO TABS
0.1000 mg | ORAL_TABLET | Freq: Once | ORAL | Status: AC
  Administered 2021-11-14: 0.1 mg via ORAL

## 2021-11-14 NOTE — Progress Notes (Signed)
BP (!) 160/102   Pulse 79   Temp 98.4 F (36.9 C) (Oral)   Ht 5' 4.17" (1.63 m)   Wt 236 lb 3.2 oz (107.1 kg)   BMI 40.33 kg/m    Subjective:    Patient ID: Heidi Nichols, female    DOB: 1986/10/11, 35 y.o.   MRN: [id number]  Chief Complaint  Patient presents with   Hypertension    HPI: Heidi Nichols is a 35 y.o. female  Hypertension This is a chronic problem. The current episode started more than 1 year ago. The problem has been gradually worsening since onset. The problem is uncontrolled. Associated symptoms include peripheral edema and shortness of breath. Pertinent negatives include no anxiety, blurred vision, chest pain, headaches, malaise/fatigue, neck pain, orthopnea, palpitations, PND or sweats.   Chief Complaint  Patient presents with   Hypertension    Relevant past medical, surgical, family and social history reviewed and updated as indicated. Interim medical history since our last visit reviewed. Allergies and medications reviewed and updated.  Review of Systems  Constitutional:  Negative for malaise/fatigue.  Eyes:  Negative for blurred vision.  Respiratory:  Positive for shortness of breath.   Cardiovascular:  Negative for chest pain, palpitations, orthopnea and PND.  Musculoskeletal:  Negative for neck pain.  Neurological:  Negative for headaches.   Per HPI unless specifically indicated above     Objective:    BP (!) 160/102   Pulse 79   Temp 98.4 F (36.9 C) (Oral)   Ht 5' 4.17" (1.63 m)   Wt 236 lb 3.2 oz (107.1 kg)   BMI 40.33 kg/m   Wt Readings from Last 3 Encounters:  11/14/21 236 lb 3.2 oz (107.1 kg)  10/26/21 237 lb (107.5 kg)  10/03/21 236 lb (107 kg)    Physical Exam Vitals and nursing note reviewed.  Constitutional:      General: She is not in acute distress.    Appearance: Normal appearance. She is not ill-appearing or diaphoretic.  HENT:     Head: Normocephalic and atraumatic.     Right Ear: Tympanic membrane and  external ear normal. There is no impacted cerumen.     Left Ear: External ear normal.     Nose: No congestion or rhinorrhea.     Mouth/Throat:     Pharynx: No oropharyngeal exudate or posterior oropharyngeal erythema.  Eyes:     Conjunctiva/sclera: Conjunctivae normal.     Pupils: Pupils are equal, round, and reactive to light.  Cardiovascular:     Rate and Rhythm: Normal rate and regular rhythm.     Heart sounds: No murmur heard.   No friction rub. No gallop.  Pulmonary:     Effort: No respiratory distress.     Breath sounds: No stridor. No wheezing or rhonchi.  Chest:     Chest wall: No tenderness.  Abdominal:     General: Abdomen is flat. Bowel sounds are normal. There is no distension.     Palpations: Abdomen is soft. There is no mass.     Tenderness: There is no abdominal tenderness. There is no guarding.  Musculoskeletal:        General: No swelling, tenderness, deformity or signs of injury.     Cervical back: Normal range of motion and neck supple. No rigidity or tenderness.     Right lower leg: Edema present.     Left lower leg: Edema present.  Skin:    General: Skin is warm and dry.  Coloration: Skin is not jaundiced.     Findings: No erythema.  Neurological:     Mental Status: She is alert and oriented to person, place, and time. Mental status is at baseline.  Psychiatric:        Mood and Affect: Mood normal.        Behavior: Behavior normal.        Thought Content: Thought content normal.        Judgment: Judgment normal.    Results for orders placed or performed in visit on 10/03/21  Lipid panel  Result Value Ref Range   Cholesterol, Total 278 (H) 100 - 199 mg/dL   Triglycerides 510 0 - 149 mg/dL   HDL 51 >25 mg/dL   VLDL Cholesterol Cal 19 5 - 40 mg/dL   LDL Chol Calc (NIH) 852 (H) 0 - 99 mg/dL   Chol/HDL Ratio 5.5 (H) 0.0 - 4.4 ratio        Current Outpatient Medications:    hydrochlorothiazide (HYDRODIURIL) 12.5 MG tablet, Take 1 tablet (12.5 mg  total) by mouth daily., Disp: 30 tablet, Rfl: 3   losartan (COZAAR) 100 MG tablet, Take 1 tablet (100 mg total) by mouth daily., Disp: 90 tablet, Rfl: 4   nicotine (NICODERM CQ) 21 mg/24hr patch, Place 1 patch (21 mg total) onto the skin daily., Disp: 28 patch, Rfl: 0    Assessment & Plan:  Varicose veins : seen vascular surg for [place] to have ? Laser   2. HTN add norvasc 5 mg.  Is on losartan 100 mg daily. Walking 30 mins a day,  Hctz - will 12.5 mg  Continue current meds.  Medication compliance emphasised. pt advised to keep Bp logs. Pt verbalised understanding of the same. Pt to have a low salt diet . Exercise to reach a goal of at least 150 mins a week.  lifestyle modifications explained and pt understands importance of the above.  Etoh abuse - drinks one drink everyday was a heavy drinker 6 months ago. Her husband has quit drinking . Will check ECHO - will need to see cards.  maybe has one drink a day - 1 shot of whiskey Sunday 1 beer.  Almost there with quitting.    Problem List Items Addressed This Visit       Cardiovascular and Mediastinum   Hypertension   Relevant Medications   hydrochlorothiazide (HYDRODIURIL) 12.5 MG tablet   Other Relevant Orders   Ambulatory referral to Cardiology   Other Visit Diagnoses     ETOH abuse    -  Primary   Relevant Orders   Ambulatory referral to Cardiology   Pedal edema       Relevant Orders   Ambulatory referral to Cardiology        Orders Placed This Encounter  Procedures   Ambulatory referral to Cardiology     Meds ordered this encounter  Medications   DISCONTD: amLODipine (NORVASC) 5 MG tablet    Sig: Take 1 tablet (5 mg total) by mouth daily.    Dispense:  30 tablet    Refill:  3   hydrochlorothiazide (HYDRODIURIL) 12.5 MG tablet    Sig: Take 1 tablet (12.5 mg total) by mouth daily.    Dispense:  30 tablet    Refill:  3     Follow up plan: No follow-ups on file.   Total time spent with patient was > 40  mins  including charting and cordinating care with specialists.    CLONIDINE  0.1 MG was given to pt and she was recheckd in 40 mins - bp much improved to 128/ 88 mm hg  Heidi Nichols

## 2021-11-14 NOTE — Progress Notes (Signed)
Patient was given 0.1mg  of Clonidine for an elevated BP of 168/104 at rest. Will recheck again in 40 mins.

## 2021-11-28 ENCOUNTER — Ambulatory Visit: Payer: Commercial Managed Care - PPO | Admitting: Internal Medicine

## 2021-11-28 ENCOUNTER — Other Ambulatory Visit: Payer: Self-pay

## 2021-11-28 ENCOUNTER — Encounter: Payer: Self-pay | Admitting: Internal Medicine

## 2021-11-28 VITALS — BP 148/102 | HR 83 | Temp 98.9°F | Wt 240.5 lb

## 2021-11-28 DIAGNOSIS — I10 Essential (primary) hypertension: Secondary | ICD-10-CM

## 2021-11-28 DIAGNOSIS — R609 Edema, unspecified: Secondary | ICD-10-CM

## 2021-11-28 DIAGNOSIS — M79641 Pain in right hand: Secondary | ICD-10-CM | POA: Insufficient documentation

## 2021-11-28 DIAGNOSIS — R600 Localized edema: Secondary | ICD-10-CM

## 2021-11-28 DIAGNOSIS — M79642 Pain in left hand: Secondary | ICD-10-CM | POA: Insufficient documentation

## 2021-11-28 MED ORDER — ATORVASTATIN CALCIUM 20 MG PO TABS: 20.0000 mg | ORAL_TABLET | Freq: Every day | ORAL | 3 refills | Status: DC

## 2021-11-28 NOTE — Progress Notes (Signed)
BP (!) 148/102   Pulse 83   Temp 98.9 F (37.2 C) (Oral)   Wt 240 lb 8 oz (109.1 kg)   SpO2 99%   BMI 41.06 kg/m    Subjective:    Patient ID: Heidi Nichols, female    DOB: 1986-08-22, 35 y.o.   MRN: [id number]  Chief Complaint  Patient presents with   Hypertension    Follow up for hypertension. Patient has no concerns at this moment    HPI: Heidi Nichols is a 35 y.o. female  HPI  Chief Complaint  Patient presents with   Hypertension    Follow up for hypertension. Patient has no concerns at this moment    Relevant past medical, surgical, family and social history reviewed and updated as indicated. Interim medical history since our last visit reviewed. Allergies and medications reviewed and updated.  Review of Systems  Per HPI unless specifically indicated above     Objective:    BP (!) 148/102   Pulse 83   Temp 98.9 F (37.2 C) (Oral)   Wt 240 lb 8 oz (109.1 kg)   SpO2 99%   BMI 41.06 kg/m   Wt Readings from Last 3 Encounters:  11/28/21 240 lb 8 oz (109.1 kg)  11/14/21 236 lb 3.2 oz (107.1 kg)  10/26/21 237 lb (107.5 kg)    Physical Exam  Results for orders placed or performed in visit on 10/03/21  Lipid panel  Result Value Ref Range   Cholesterol, Total 278 (H) 100 - 199 mg/dL   Triglycerides 578 0 - 149 mg/dL   HDL 51 >46 mg/dL   VLDL Cholesterol Cal 19 5 - 40 mg/dL   LDL Chol Calc (NIH) 962 (H) 0 - 99 mg/dL   Chol/HDL Ratio 5.5 (H) 0.0 - 4.4 ratio        Current Outpatient Medications:    hydrochlorothiazide (HYDRODIURIL) 12.5 MG tablet, Take 1 tablet (12.5 mg total) by mouth daily., Disp: 30 tablet, Rfl: 3   losartan (COZAAR) 100 MG tablet, Take 1 tablet (100 mg total) by mouth daily., Disp: 90 tablet, Rfl: 4   nicotine (NICODERM CQ) 21 mg/24hr patch, Place 1 patch (21 mg total) onto the skin daily., Disp: 28 patch, Rfl: 0    Assessment & Plan:  Htn add amlodipine 5 mg daily was on this in the past.  Hctz 12.5 mg and  losartan 100 mg. Continue current meds.  Medication compliance emphasised. pt advised to keep Bp logs. Pt verbalised understanding of the same. Pt to have a low salt diet . Exercise to reach a goal of at least 150 mins a week.  lifestyle modifications explained and pt understands importance of the above.   Pedal edema/ uncontrolled htn/ / etoh abuse Will check ECHO  Sleeps about 9 pm -- wakes up at 2 or 3 am.   Smoking cessation: better now down to 1/2 ppd  Cut back on ETOH abuse as well.   HLD:  add Lipitor LDL goal < 130 at 208 recheck recheck FLP, check LFT's work on diet, SE of meds explained to pt. low fat and high fiber diet explained to pt.   Latest Reference Range & Units 10/03/21 11:50  Total CHOL/HDL Ratio 0.0 - 4.4 ratio 5.5 (H)  Cholesterol, Total 100 - 199 mg/dL 952 (H)  HDL Cholesterol >39 mg/dL 51  Triglycerides 0 - 841 mg/dL 324  VLDL Cholesterol Cal 5 - 40 mg/dL 19  LDL Chol Calc (NIH) 0 -  99 mg/dL 224 (H)  (H): Data is abnormally high    Problem List Items Addressed This Visit   None    Orders Placed This Encounter  Procedures   DG Hand Complete Right   DG Hand Complete Left   Rheumatoid factor   ANA   Uric acid   Comprehensive metabolic panel   CBC with Differential/Platelet     Meds ordered this encounter  Medications   atorvastatin (LIPITOR) 20 MG tablet    Sig: Take 1 tablet (20 mg total) by mouth daily.    Dispense:  30 tablet    Refill:  3     Follow up plan: No follow-ups on file.

## 2021-11-30 ENCOUNTER — Ambulatory Visit: Admission: RE | Admit: 2021-11-30 | Payer: Commercial Managed Care - PPO | Source: Ambulatory Visit

## 2021-12-06 ENCOUNTER — Ambulatory Visit: Payer: Commercial Managed Care - PPO | Admitting: Internal Medicine

## 2021-12-06 NOTE — Progress Notes (Deleted)
New Outpatient Visit Date: 12/06/2021  Referring Provider: Loura Pardon, MD [address] [place],  [place] [address]  Chief Complaint: ***  HPI:  Heidi Nichols is a 35 y.o. female who is being seen today for the evaluation of pedal edema at the request of Heidi Nichols. She has a history of hypertension, varicose veins, and alcohol abuse.  She was seen by Heidi Nichols last month and complained of persistent lower extremity edema with prior vascular surgery evaluation noting varicose veins.  Heidi Nichols was referred for echocardiogram but was a no-show for this appointment.  --------------------------------------------------------------------------------------------------  Cardiovascular History & Procedures: Cardiovascular Problems: Leg edema  Risk Factors: Hypertension, obesity, and tobacco use  Cath/PCI: None  CV Surgery: None  EP Procedures and Devices: None  Non-Invasive Evaluation(s): Lower extremity venous duplex (11/01/2021): No evidence of DVT in either lower extremity.  Venous reflux noted in both legs.  Recent CV Pertinent Labs: Lab Results  Component Value Date   CHOL 278 (H) 10/03/2021   HDL 51 10/03/2021   LDLCALC 208 (H) 10/03/2021   TRIG 106 10/03/2021   CHOLHDL 5.5 (H) 10/03/2021   K 4.3 09/19/2021   BUN 12 09/19/2021   CREATININE 0.61 09/19/2021    --------------------------------------------------------------------------------------------------  Past Medical History:  Diagnosis Date   Hypertension     No past surgical history on file.  No outpatient medications have been marked as taking for the 12/06/21 encounter (Appointment) with Lovella Hardie, Cristal Deer, MD.    Allergies: Patient has no known allergies.  Social History   Tobacco Use   Smoking status: Every Day    Packs/day: 0.50    Years: 8.00    Pack years: 4.00    Types: Cigarettes   Smokeless tobacco: Never  Vaping Use   Vaping Use: Some days  Substance Use Topics   Alcohol use: Yes     Alcohol/week: 2.0 standard drinks    Types: 2 Standard drinks or equivalent per week    Comment: social   Drug use: No    Family History  Problem Relation Age of Onset   COPD Mother    Hypertension Mother    Asthma Mother    Heart attack Mother    Prostate cancer Father    COPD Maternal Grandmother    Heart attack Paternal Grandfather     Review of Systems: A 12-system review of systems was performed and was negative except as noted in the HPI.  --------------------------------------------------------------------------------------------------  Physical Exam: There were no vitals taken for this visit.  General:  *** HEENT: No conjunctival pallor or scleral icterus. Facemask in place. Neck: Supple without lymphadenopathy, thyromegaly, JVD, or HJR. No carotid bruit. Lungs: Normal work of breathing. Clear to auscultation bilaterally without wheezes or crackles. Heart: Regular rate and rhythm without murmurs, rubs, or gallops. Non-displaced PMI. Abd: Bowel sounds present. Soft, NT/ND without hepatosplenomegaly Ext: No lower extremity edema. Radial, PT, and DP pulses are 2+ bilaterally Skin: Warm and dry without rash. Neuro: CNIII-XII intact. Strength and fine-touch sensation intact in upper and lower extremities bilaterally. Psych: Normal mood and affect.  EKG:  ***  Lab Results  Component Value Date   WBC 7.1 09/19/2021   HGB 15.7 09/19/2021   HCT 46.4 09/19/2021   MCV 97 09/19/2021   PLT 183 09/19/2021    Lab Results  Component Value Date   NA 143 09/19/2021   K 4.3 09/19/2021   CL 105 09/19/2021   CO2 24 09/19/2021   BUN 12 09/19/2021  CREATININE 0.61 09/19/2021   GLUCOSE 102 (H) 09/19/2021   ALT 27 09/19/2021    Lab Results  Component Value Date   CHOL 278 (H) 10/03/2021   HDL 51 10/03/2021   LDLCALC 208 (H) 10/03/2021   TRIG 106 10/03/2021   CHOLHDL 5.5 (H) 10/03/2021      --------------------------------------------------------------------------------------------------  ASSESSMENT AND PLAN: Heidi Kendall, MD 12/06/2021 7:38 AM

## 2021-12-19 ENCOUNTER — Other Ambulatory Visit: Payer: Commercial Managed Care - PPO

## 2021-12-26 ENCOUNTER — Ambulatory Visit: Payer: Commercial Managed Care - PPO | Admitting: Internal Medicine

## 2022-01-03 NOTE — Progress Notes (Deleted)
New Outpatient Visit Date: 01/09/2022  Referring Provider: Charlynne Cousins, MD [address] [place],  [place] [address]  Chief Complaint: ***  HPI:  Heidi Nichols is a 36 y.o. female who is being seen today for the evaluation of pedal edema at the request of Dr. Neomia Dear. She has a history of hypertension, varicose veins, and alcohol abuse.  She was seen by Dr. Neomia Dear last month and complained of persistent lower extremity edema with prior vascular surgery evaluation noting varicose veins.  [name] was referred for echocardiogram but was a no-show for this appointment.  --------------------------------------------------------------------------------------------------  Cardiovascular History & Procedures: Cardiovascular Problems: Leg edema  Risk Factors: Hypertension, obesity, and tobacco use  Cath/PCI: None  CV Surgery: None  EP Procedures and Devices: None  Non-Invasive Evaluation(s): Lower extremity venous duplex (11/01/2021): No evidence of DVT in either lower extremity.  Venous reflux noted in both legs.  Recent CV Pertinent Labs: Lab Results  Component Value Date   CHOL 278 (H) 10/03/2021   HDL 51 10/03/2021   LDLCALC 208 (H) 10/03/2021   TRIG 106 10/03/2021   CHOLHDL 5.5 (H) 10/03/2021   K 4.3 09/19/2021   BUN 12 09/19/2021   CREATININE 0.61 09/19/2021    --------------------------------------------------------------------------------------------------  Past Medical History:  Diagnosis Date   Hypertension     No past surgical history on file.  No outpatient medications have been marked as taking for the 01/09/22 encounter (Appointment) with Olaf Mesa, Harrell Gave, MD.    Allergies: Patient has no known allergies.  Social History   Tobacco Use   Smoking status: Every Day    Packs/day: 0.50    Years: 8.00    Pack years: 4.00    Types: Cigarettes   Smokeless tobacco: Never  Vaping Use   Vaping Use: Some days  Substance Use Topics   Alcohol use: Yes     Alcohol/week: 2.0 standard drinks    Types: 2 Standard drinks or equivalent per week    Comment: social   Drug use: No    Family History  Problem Relation Age of Onset   COPD Mother    Hypertension Mother    Asthma Mother    Heart attack Mother    Prostate cancer Father    COPD Maternal Grandmother    Heart attack Paternal Grandfather     Review of Systems: A 12-system review of systems was performed and was negative except as noted in the HPI.  --------------------------------------------------------------------------------------------------  Physical Exam: There were no vitals taken for this visit.  General:  *** HEENT: No conjunctival pallor or scleral icterus. Facemask in place. Neck: Supple without lymphadenopathy, thyromegaly, JVD, or HJR. No carotid bruit. Lungs: Normal work of breathing. Clear to auscultation bilaterally without wheezes or crackles. Heart: Regular rate and rhythm without murmurs, rubs, or gallops. Non-displaced PMI. Abd: Bowel sounds present. Soft, NT/ND without hepatosplenomegaly Ext: No lower extremity edema. Radial, PT, and DP pulses are 2+ bilaterally Skin: Warm and dry without rash. Neuro: CNIII-XII intact. Strength and fine-touch sensation intact in upper and lower extremities bilaterally. Psych: Normal mood and affect.  EKG:  ***  Lab Results  Component Value Date   WBC 7.1 09/19/2021   HGB 15.7 09/19/2021   HCT 46.4 09/19/2021   MCV 97 09/19/2021   PLT 183 09/19/2021    Lab Results  Component Value Date   NA 143 09/19/2021   K 4.3 09/19/2021   CL 105 09/19/2021   CO2 24 09/19/2021   BUN 12 09/19/2021  CREATININE 0.61 09/19/2021   GLUCOSE 102 (H) 09/19/2021   ALT 27 09/19/2021    Lab Results  Component Value Date   CHOL 278 (H) 10/03/2021   HDL 51 10/03/2021   LDLCALC 208 (H) 10/03/2021   TRIG 106 10/03/2021   CHOLHDL 5.5 (H) 10/03/2021      --------------------------------------------------------------------------------------------------  ASSESSMENT AND PLAN: Nelva Bush, MD 01/03/2022 1:14 PM

## 2022-01-09 ENCOUNTER — Ambulatory Visit: Payer: Commercial Managed Care - PPO | Admitting: Internal Medicine

## 2022-02-07 ENCOUNTER — Ambulatory Visit: Payer: Commercial Managed Care - PPO | Admitting: Cardiology

## 2022-02-08 ENCOUNTER — Encounter: Payer: Self-pay | Admitting: Cardiology

## 2022-08-13 ENCOUNTER — Telehealth: Payer: Self-pay

## 2022-08-13 NOTE — Telephone Encounter (Signed)
Called patient to discuss refill request received for Amlodipine and HCTZ no answer, left a voicemail for patient to return my call.    Ok for nurse triage to give results if patient calls back.

## 2022-09-21 DIAGNOSIS — F1010 Alcohol abuse, uncomplicated: Secondary | ICD-10-CM | POA: Insufficient documentation

## 2022-09-24 ENCOUNTER — Ambulatory Visit: Payer: Commercial Managed Care - PPO | Admitting: Nurse Practitioner

## 2022-09-24 DIAGNOSIS — I10 Essential (primary) hypertension: Secondary | ICD-10-CM

## 2022-09-24 DIAGNOSIS — Z1159 Encounter for screening for other viral diseases: Secondary | ICD-10-CM

## 2022-09-24 DIAGNOSIS — Z114 Encounter for screening for human immunodeficiency virus [HIV]: Secondary | ICD-10-CM

## 2022-09-24 DIAGNOSIS — F1010 Alcohol abuse, uncomplicated: Secondary | ICD-10-CM

## 2022-09-24 DIAGNOSIS — E559 Vitamin D deficiency, unspecified: Secondary | ICD-10-CM

## 2022-09-24 DIAGNOSIS — F17229 Nicotine dependence, chewing tobacco, with unspecified nicotine-induced disorders: Secondary | ICD-10-CM

## 2022-09-24 DIAGNOSIS — E781 Pure hyperglyceridemia: Secondary | ICD-10-CM

## 2022-09-27 NOTE — Patient Instructions (Incomplete)

## 2022-10-02 ENCOUNTER — Ambulatory Visit: Payer: Commercial Managed Care - PPO | Admitting: Nurse Practitioner

## 2022-10-02 ENCOUNTER — Encounter: Payer: Self-pay | Admitting: Nurse Practitioner

## 2022-10-02 VITALS — BP 158/98 | HR 81 | Temp 98.2°F | Ht 64.0 in | Wt 259.3 lb

## 2022-10-02 DIAGNOSIS — I10 Essential (primary) hypertension: Secondary | ICD-10-CM

## 2022-10-02 DIAGNOSIS — M79642 Pain in left hand: Secondary | ICD-10-CM

## 2022-10-02 DIAGNOSIS — M79641 Pain in right hand: Secondary | ICD-10-CM

## 2022-10-02 DIAGNOSIS — Z114 Encounter for screening for human immunodeficiency virus [HIV]: Secondary | ICD-10-CM

## 2022-10-02 DIAGNOSIS — E559 Vitamin D deficiency, unspecified: Secondary | ICD-10-CM | POA: Diagnosis not present

## 2022-10-02 DIAGNOSIS — F17229 Nicotine dependence, chewing tobacco, with unspecified nicotine-induced disorders: Secondary | ICD-10-CM

## 2022-10-02 DIAGNOSIS — F1010 Alcohol abuse, uncomplicated: Secondary | ICD-10-CM

## 2022-10-02 DIAGNOSIS — I83813 Varicose veins of bilateral lower extremities with pain: Secondary | ICD-10-CM

## 2022-10-02 DIAGNOSIS — E781 Pure hyperglyceridemia: Secondary | ICD-10-CM

## 2022-10-02 DIAGNOSIS — Z1159 Encounter for screening for other viral diseases: Secondary | ICD-10-CM

## 2022-10-02 DIAGNOSIS — E538 Deficiency of other specified B group vitamins: Secondary | ICD-10-CM

## 2022-10-02 MED ORDER — ATORVASTATIN CALCIUM 20 MG PO TABS: 20.0000 mg | ORAL_TABLET | Freq: Every day | ORAL | 4 refills | Status: DC

## 2022-10-02 MED ORDER — NICOTINE 21 MG/24HR TD PT24: 21.0000 mg | MEDICATED_PATCH | Freq: Every day | TRANSDERMAL | 6 refills | Status: DC

## 2022-10-02 MED ORDER — LOSARTAN POTASSIUM 100 MG PO TABS: 100.0000 mg | ORAL_TABLET | Freq: Every day | ORAL | 4 refills | Status: DC

## 2022-10-02 MED ORDER — HYDROCHLOROTHIAZIDE 12.5 MG PO TABS: 12.5000 mg | ORAL_TABLET | Freq: Every day | ORAL | 4 refills | Status: DC

## 2022-10-02 MED ORDER — AMLODIPINE BESYLATE 5 MG PO TABS: 5.0000 mg | ORAL_TABLET | Freq: Every day | ORAL | 4 refills | Status: DC

## 2022-10-02 NOTE — Assessment & Plan Note (Signed)
Chronic, is cutting back.  Family history of substance abuse in her mother.  Recommend continue to cut back and work towards complete cessation.  Discussed affect of alcohol on blood pressure and concern for this.  Check labs today.

## 2022-10-02 NOTE — Assessment & Plan Note (Addendum)
I have recommended complete cessation of tobacco use. I have sent refill on patches per request.

## 2022-10-02 NOTE — Assessment & Plan Note (Signed)
Chronic, ongoing. Continue current medication regimen and adjust as needed.  Refills sent in. 

## 2022-10-02 NOTE — Assessment & Plan Note (Signed)
Chronic, ongoing with worsening weakness.  Referral to ortho placed and will check labs today: CRP, ESR, TSH, uric acid, CBC, Vit D.

## 2022-10-02 NOTE — Assessment & Plan Note (Signed)
Chronic, current BP above goal in office but has been without medication for several months.  Will restart all medications today.  Recommend she monitor BP at least a few mornings a week at home and document.  DASH diet at home.  Labs today: CBC, CMP, Lipid, TSH.  Cut back on alcohol and smoking.  Return in 4 weeks.

## 2022-10-02 NOTE — Assessment & Plan Note (Signed)
Continues to have issues with this, even though is using compression hose and Tylenol as needed.  Recommend return to vascular which she will call to schedule.

## 2022-10-02 NOTE — Progress Notes (Signed)
BP (!) 158/98 (BP Location: Left Arm, Patient Position: Sitting, Cuff Size: Normal)   Pulse 81   Temp 98.2 F (36.8 C) (Oral)   Ht _0  (1.626 m)   Wt 259 lb 4.8 oz (117.6 kg)   SpO2 97%   BMI 44.51 kg/m    Subjective:    Patient ID: Heidi Nichols, female    DOB: 06-19-1986, 36 y.o.   MRN: [id number]  HPI: Heidi Nichols is a 36 y.o. female  Chief Complaint  Patient presents with   Medication Refill    Patient says she has not been seen in office since [place] left and is requesting refills on all her medications. Patient denies having any concerns at today's visit. Patient says she is just trying to pick up where she left off, as she just stopped coming.    HYPERTENSION / HYPERLIPIDEMIA To be taking on Amlodipine, HCTZ, Losartan, and Atorvastatin. She stopped taking medication many months back due to no refills.  Has cut back on alcohol use, but has returned to smoking.  Has two liquor shots daily at present and is currently smoking 1/2 PPD -- would like to restart patches which she did well with.  Started smoking at age 28 to 88.  Has varicose veins to both legs that are continuing to cause issues, saw vascular last 10/26/21.  Would like to return due to ongoing pain with these and worsening.  Wears compression at times at work.   Satisfied with current treatment? yes Duration of hypertension: chronic BP monitoring frequency: not checking BP range:  BP medication side effects: no Past BP meds: as above Duration of hyperlipidemia: chronic Cholesterol medication side effects: no Cholesterol supplements: none Past cholesterol medications: as above Medication compliance: good compliance Aspirin: no Recent stressors: no Recurrent headaches: no Visual changes: no Palpitations: no Dyspnea: no Chest pain: no Lower extremity edema: no Dizzy/lightheaded: no The ASCVD Risk score (Arnett DK, et al., 2019) failed to calculate for the following reasons:   The 2019  ASCVD risk score is only valid for ages 50 to 73  HAND PAIN Ongoing issue, 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  Has always used her hands a lot in her jobs.  She is right handed and this hand hurts worse then left at baseline.  Duration: months Involved hand: bilateral Mechanism of injury: unknown Location: diffuse Onset: gradual Severity: 6/10  Quality: dull, aching, burning, and throbbing Frequency: constant Radiation: no Aggravating factors: repetitive movement Alleviating factors: compression at times, liquor intake, Tylenol muscle/back Treatments attempted:  Relief with NSAIDs?: No NSAIDs Taken Weakness:  occasional Numbness: no Redness: no Swelling: occasional Bruising: no Fevers: no    Relevant past medical, surgical, family and social history reviewed and updated as indicated. Interim medical history since our last visit reviewed. Allergies and medications reviewed and updated.  Review of Systems  Constitutional:  Negative for activity change, appetite change, diaphoresis, fatigue and fever.  Respiratory:  Negative for cough, chest tightness and shortness of breath.   Cardiovascular:  Negative for chest pain, palpitations and leg swelling.  Gastrointestinal: Negative.   Endocrine: Negative for cold intolerance and heat intolerance.  Musculoskeletal:  Positive for arthralgias.  Neurological:  Positive for weakness (hands only). Negative for dizziness, syncope, light-headedness, numbness and headaches.  Psychiatric/Behavioral: Negative.      Per HPI unless specifically indicated above     Objective:    BP (!) 158/98 (BP Location: Left Arm, Patient Position: Sitting, Cuff Size: Normal)   Pulse  81   Temp 98.2 F (36.8 C) (Oral)   Ht _0  (1.626 m)   Wt 259 lb 4.8 oz (117.6 kg)   SpO2 97%   BMI 44.51 kg/m   Wt Readings from Last 3 Encounters:  10/02/22 259 lb 4.8 oz (117.6 kg)  11/28/21 240 lb 8 oz (109.1 kg)  11/14/21 236 lb 3.2 oz (107.1 kg)     Physical Exam Vitals and nursing note reviewed.  Constitutional:      General: She is awake. She is not in acute distress.    Appearance: She is well-developed and well-groomed. She is obese. She is not ill-appearing or toxic-appearing.  HENT:     Head: Normocephalic.     Right Ear: Hearing, ear canal and external ear normal.     Left Ear: Hearing, ear canal and external ear normal.  Eyes:     General: Lids are normal.        Right eye: No discharge.        Left eye: No discharge.     Conjunctiva/sclera: Conjunctivae normal.     Pupils: Pupils are equal, round, and reactive to light.  Neck:     Thyroid: No thyromegaly.     Vascular: No carotid bruit.  Cardiovascular:     Rate and Rhythm: Normal rate and regular rhythm.     Heart sounds: Normal heart sounds. No murmur heard.    No gallop.  Pulmonary:     Effort: Pulmonary effort is normal. No accessory muscle usage or respiratory distress.     Breath sounds: Normal breath sounds.  Abdominal:     General: Bowel sounds are normal.     Palpations: Abdomen is soft.  Musculoskeletal:     Cervical back: Normal range of motion and neck supple.     Right lower leg: No edema.     Left lower leg: No edema.  Lymphadenopathy:     Cervical: No cervical adenopathy.  Skin:    General: Skin is warm and dry.  Neurological:     Mental Status: She is alert and oriented to person, place, and time.  Psychiatric:        Attention and Perception: Attention normal.        Mood and Affect: Mood normal.        Speech: Speech normal.        Behavior: Behavior normal. Behavior is cooperative.        Thought Content: Thought content normal.     Results for orders placed or performed in visit on 10/03/21  Lipid panel  Result Value Ref Range   Cholesterol, Total 278 (H) 100 - 199 mg/dL   Triglycerides 106 0 - 149 mg/dL   HDL 51 >39 mg/dL   VLDL Cholesterol Cal 19 5 - 40 mg/dL   LDL Chol Calc (NIH) 208 (H) 0 - 99 mg/dL   Chol/HDL Ratio 5.5  (H) 0.0 - 4.4 ratio      Assessment & Plan:   Problem List Items Addressed This Visit       Cardiovascular and Mediastinum   Hypertension - Primary    Chronic, current BP above goal in office but has been without medication for several months.  Will restart all medications today.  Recommend she monitor BP at least a few mornings a week at home and document.  DASH diet at home.  Labs today: CBC, CMP, Lipid, TSH.  Cut back on alcohol and smoking.  Return in 4 weeks.  Relevant Medications   amLODipine (NORVASC) 5 MG tablet   atorvastatin (LIPITOR) 20 MG tablet   hydrochlorothiazide (HYDRODIURIL) 12.5 MG tablet   losartan (COZAAR) 100 MG tablet   Other Relevant Orders   CBC with Differential/Platelet   Comprehensive metabolic panel   TSH   Varicose veins of both lower extremities    Continues to have issues with this, even though is using compression hose and Tylenol as needed.  Recommend return to vascular which she will call to schedule.      Relevant Medications   amLODipine (NORVASC) 5 MG tablet   atorvastatin (LIPITOR) 20 MG tablet   hydrochlorothiazide (HYDRODIURIL) 12.5 MG tablet   losartan (COZAAR) 100 MG tablet     Other   ETOH abuse    Chronic, is cutting back.  Family history of substance abuse in her mother.  Recommend continue to cut back and work towards complete cessation.  Discussed affect of alcohol on blood pressure and concern for this.  Check labs today.      Relevant Orders   Vitamin B12   Nicotine dependence, chewing tobacco, w unsp disorders    I have recommended complete cessation of tobacco use. I have sent refill on patches per request.       Relevant Medications   nicotine (NICODERM CQ) 21 mg/24hr patch   Pain in both hands    Chronic, ongoing with worsening weakness.  Referral to ortho placed and will check labs today: CRP, ESR, TSH, uric acid, CBC, Vit D.      Relevant Orders   Uric acid   C-reactive protein   Sed Rate (ESR)    Ambulatory referral to Orthopedics   HgB A1c   Pure hypertriglyceridemia    Chronic, ongoing.  Continue current medication regimen and adjust as needed.  Refills sent in.      Relevant Medications   amLODipine (NORVASC) 5 MG tablet   atorvastatin (LIPITOR) 20 MG tablet   hydrochlorothiazide (HYDRODIURIL) 12.5 MG tablet   losartan (COZAAR) 100 MG tablet   Other Relevant Orders   Comprehensive metabolic panel   Lipid Panel w/o Chol/HDL Ratio   Other Visit Diagnoses     Vitamin D deficiency       History of low levels reported, check today and initiate supplement as needed.   Relevant Orders   VITAMIN D 25 Hydroxy (Vit-D Deficiency, Fractures)   Vitamin B12 deficiency       History of low levels reported, check today and initiate supplement as needed.   Relevant Orders   Vitamin B12   Need for hepatitis C screening test       Hep C screen on labs today per guidelines for one time screening, discussed with patient.   Relevant Orders   Hepatitis C antibody   Encounter for screening for HIV       HIV screen on labs today per guidelines for one time screening, discussed with patient.   Relevant Orders   HIV Antibody (routine testing w rflx)        Follow up plan: Return in about 4 weeks (around 10/30/2022) for HTN.

## 2022-10-03 ENCOUNTER — Encounter: Payer: Self-pay | Admitting: Nurse Practitioner

## 2022-10-03 DIAGNOSIS — E559 Vitamin D deficiency, unspecified: Secondary | ICD-10-CM | POA: Insufficient documentation

## 2022-10-03 DIAGNOSIS — E538 Deficiency of other specified B group vitamins: Secondary | ICD-10-CM | POA: Insufficient documentation

## 2022-10-03 DIAGNOSIS — R700 Elevated erythrocyte sedimentation rate: Secondary | ICD-10-CM | POA: Insufficient documentation

## 2022-10-03 LAB — COMPREHENSIVE METABOLIC PANEL
ALT: 29 IU/L (ref 0–32)
AST: 24 IU/L (ref 0–40)
Albumin/Globulin Ratio: 1.5 (ref 1.2–2.2)
Albumin: 4.4 g/dL (ref 3.9–4.9)
Alkaline Phosphatase: 96 IU/L (ref 44–121)
BUN/Creatinine Ratio: 16 (ref 9–23)
BUN: 13 mg/dL (ref 6–20)
Bilirubin Total: 0.5 mg/dL (ref 0.0–1.2)
CO2: 24 mmol/L (ref 20–29)
Calcium: 9.2 mg/dL (ref 8.7–10.2)
Chloride: 101 mmol/L (ref 96–106)
Creatinine, Ser: 0.79 mg/dL (ref 0.57–1.00)
Globulin, Total: 2.9 g/dL (ref 1.5–4.5)
Glucose: 87 mg/dL (ref 70–99)
Potassium: 3.8 mmol/L (ref 3.5–5.2)
Sodium: 140 mmol/L (ref 134–144)
Total Protein: 7.3 g/dL (ref 6.0–8.5)
eGFR: 99 mL/min/{1.73_m2} (ref >59)

## 2022-10-03 LAB — C-REACTIVE PROTEIN: CRP: 9 mg/L (ref 0–10)

## 2022-10-03 LAB — CBC WITH DIFFERENTIAL/PLATELET
Basophils Absolute: 0 10*3/uL (ref 0.0–0.2)
Basos: 0 %
EOS (ABSOLUTE): 0.1 10*3/uL (ref 0.0–0.4)
Eos: 2 %
Hematocrit: 41.7 % (ref 34.0–46.6)
Hemoglobin: 14.1 g/dL (ref 11.1–15.9)
Immature Grans (Abs): 0 10*3/uL (ref 0.0–0.1)
Immature Granulocytes: 0 %
Lymphocytes Absolute: 1.7 10*3/uL (ref 0.7–3.1)
Lymphs: 23 %
MCH: 32.9 pg (ref 26.6–33.0)
MCHC: 33.8 g/dL (ref 31.5–35.7)
MCV: 97 fL (ref 79–97)
Monocytes Absolute: 0.5 10*3/uL (ref 0.1–0.9)
Monocytes: 7 %
Neutrophils Absolute: 5.1 10*3/uL (ref 1.4–7.0)
Neutrophils: 68 %
Platelets: 174 10*3/uL (ref 150–450)
RBC: 4.29 x10E6/uL (ref 3.77–5.28)
RDW: 12.3 % (ref 11.7–15.4)
WBC: 7.5 10*3/uL (ref 3.4–10.8)

## 2022-10-03 LAB — SEDIMENTATION RATE: Sed Rate: 52 mm/hr — ABNORMAL HIGH (ref 0–32)

## 2022-10-03 LAB — VITAMIN B12: Vitamin B-12: 263 pg/mL (ref 232–1245)

## 2022-10-03 LAB — LIPID PANEL W/O CHOL/HDL RATIO
Cholesterol, Total: 270 mg/dL — ABNORMAL HIGH (ref 100–199)
HDL: 47 mg/dL (ref >39)
LDL Chol Calc (NIH): 164 mg/dL — ABNORMAL HIGH (ref 0–99)
Triglycerides: 313 mg/dL — ABNORMAL HIGH (ref 0–149)
VLDL Cholesterol Cal: 59 mg/dL — ABNORMAL HIGH (ref 5–40)

## 2022-10-03 LAB — VITAMIN D 25 HYDROXY (VIT D DEFICIENCY, FRACTURES): Vit D, 25-Hydroxy: 17.9 ng/mL — ABNORMAL LOW (ref 30.0–100.0)

## 2022-10-03 LAB — HEMOGLOBIN A1C
Est. average glucose Bld gHb Est-mCnc: 114 mg/dL
Hgb A1c MFr Bld: 5.6 % (ref 4.8–5.6)

## 2022-10-03 LAB — HEPATITIS C ANTIBODY: Hep C Virus Ab: NONREACTIVE

## 2022-10-03 LAB — URIC ACID: Uric Acid: 5 mg/dL (ref 2.6–6.2)

## 2022-10-03 LAB — HIV ANTIBODY (ROUTINE TESTING W REFLEX): HIV Screen 4th Generation wRfx: NONREACTIVE

## 2022-10-03 LAB — TSH: TSH: 2.46 u[IU]/mL (ref 0.450–4.500)

## 2022-10-03 NOTE — Progress Notes (Signed)
Contacted via [place] evening [place], your labs have returned: - Definitely restart your Atorvastatin as cholesterol levels are elevated and we want to help bring these down -- may need to adjust medication in future. - Vitamin D is low, I recommend you start taking Vitamin D3 2000 units daily for bone health, which you can obtain over the counter. Vitamin B12 level was is on low end of normal, which can cause some numbness in joints.  Start taking Vitamin B12 1000 MCG daily which you can obtain over the counter. - CBC shows no anemia or infection. - Kidney function, creatinine and eGFR, remains normal, as is liver function, AST and ALT.  Thyroid lab (TSH) is normal.  A1c shows no prediabetes or diabetes!! - Inflammatory labs show normal CRP and uric acid, but ESR mildly elevated which we will recheck next visit.   - Hep C and HIV are negative.  Any questions? Keep being amazing!!  Thank you for allowing me to participate in your care.  I appreciate you. Kindest regards, Dominic Rhome

## 2022-10-27 NOTE — Patient Instructions (Incomplete)

## 2022-10-28 ENCOUNTER — Encounter (INDEPENDENT_AMBULATORY_CARE_PROVIDER_SITE_OTHER): Payer: Self-pay

## 2022-10-30 ENCOUNTER — Encounter: Payer: Self-pay | Admitting: Nurse Practitioner

## 2022-10-30 ENCOUNTER — Ambulatory Visit: Payer: Commercial Managed Care - PPO | Admitting: Nurse Practitioner

## 2022-10-30 DIAGNOSIS — M79641 Pain in right hand: Secondary | ICD-10-CM

## 2022-10-30 DIAGNOSIS — E781 Pure hyperglyceridemia: Secondary | ICD-10-CM

## 2022-10-30 DIAGNOSIS — I10 Essential (primary) hypertension: Secondary | ICD-10-CM

## 2022-10-30 DIAGNOSIS — E538 Deficiency of other specified B group vitamins: Secondary | ICD-10-CM

## 2022-10-30 DIAGNOSIS — Z23 Encounter for immunization: Secondary | ICD-10-CM

## 2022-10-30 DIAGNOSIS — E559 Vitamin D deficiency, unspecified: Secondary | ICD-10-CM

## 2022-10-30 DIAGNOSIS — R700 Elevated erythrocyte sedimentation rate: Secondary | ICD-10-CM

## 2022-10-30 DIAGNOSIS — F1010 Alcohol abuse, uncomplicated: Secondary | ICD-10-CM

## 2024-12-07 ENCOUNTER — Ambulatory Visit

## 2024-12-07 VITALS — BP 213/117 | HR 85 | Resp 16 | Ht 64.0 in | Wt 253.0 lb

## 2024-12-07 DIAGNOSIS — I83813 Varicose veins of bilateral lower extremities with pain: Secondary | ICD-10-CM

## 2024-12-07 DIAGNOSIS — F17229 Nicotine dependence, chewing tobacco, with unspecified nicotine-induced disorders: Secondary | ICD-10-CM

## 2024-12-07 DIAGNOSIS — E781 Pure hyperglyceridemia: Secondary | ICD-10-CM

## 2024-12-07 DIAGNOSIS — I10 Essential (primary) hypertension: Secondary | ICD-10-CM

## 2024-12-07 DIAGNOSIS — Z131 Encounter for screening for diabetes mellitus: Secondary | ICD-10-CM

## 2024-12-07 MED ORDER — LOSARTAN POTASSIUM-HCTZ 100-25 MG PO TABS: 1.0000 | ORAL_TABLET | Freq: Every day | ORAL | 3 refills | Status: AC

## 2024-12-07 MED ORDER — ATORVASTATIN CALCIUM 20 MG PO TABS: 20.0000 mg | ORAL_TABLET | Freq: Every day | ORAL | 4 refills | Status: AC

## 2024-12-07 MED ORDER — NICOTINE 14 MG/24HR TD PT24: 14.0000 mg | MEDICATED_PATCH | Freq: Every day | TRANSDERMAL | 0 refills | Status: AC

## 2024-12-07 NOTE — Progress Notes (Addendum)
 New patient visit   Patient: Heidi Nichols   DOB: 06/26/1986   38 y.o. Female  MRN: [id number] Visit Date: 12/07/2024  Today's healthcare provider: Isaiah DELENA Pepper, MD   Chief Complaint  Patient presents with   New Patient (Initial Visit)    NP/BP/ Varicose veins   Subjective    Heidi Nichols is a 38 y.o. female who presents today as a new patient to establish care.   Discussed the use of AI scribe software for clinical note transcription with the patient, who gave verbal consent to proceed.  History of Present Illness Heidi Nichols is a 38 year old female with hypertension who presents for blood pressure management and evaluation of varicose veins.  Her blood pressure has been elevated. She has not taken her blood pressure medication since it ran out around Christmas of the previous year. No headaches, chest pain, or significant shortness of breath, although she experiences some shortness of breath when climbing stairs.  She is concerned about her varicose veins, which have been increasing in size and causing intermittent pain. The veins are primarily located around her knee area and are more pronounced on one side, with some swelling. A previous ultrasound in 2021 showed no clots but confirmed superficial varicose veins. She has not had a follow-up since then and is seeking further evaluation.  Her social history includes smoking half a pack of cigarettes per day, down from a full pack. She is interested in using nicotine  patches again. She consumes about two shots of alcohol daily to manage anxiety and has recently started using THC gummies for the same purpose. She has a history of significant alcohol use but has reduced her intake over time. She exercises regularly by walking on a treadmill for 45 minutes to an hour daily without experiencing shortness of breath during this activity.  She has not had a Pap smear in approximately 15 years since the birth  of her child. She has a history of high cholesterol but has not been taking her cholesterol medication recently.   BP Readings from Last 3 Encounters:  12/07/24 (!) 213/117  10/02/22 (!) 158/98  11/28/21 (!) 148/102     Past Medical History:  Diagnosis Date   Hypertension    No past surgical history on file. Family Status  Relation Name Status   Mother  Deceased at age 24       COPD   Father  Alive   Brother  Deceased   Son  Alive   MGM  Deceased   MGF  Deceased   PGM  Deceased   PGF  Deceased  No partnership data on file   Family History  Problem Relation Age of Onset   COPD Mother    Hypertension Mother    Asthma Mother    Heart attack Mother    Prostate cancer Father    COPD Maternal Grandmother    Heart attack Paternal Grandfather    Social History   Socioeconomic History   Marital status: Married    Spouse name: Not on file   Number of children: Not on file   Years of education: Not on file   Highest education level: 12th grade  Occupational History   Not on file  Tobacco Use   Smoking status: Every Day    Current packs/day: 0.50    Average packs/day: 0.5 packs/day for 8.0 years (4.0 ttl pk-yrs)    Types: Cigarettes   Smokeless tobacco: Never  Vaping Use   Vaping status: Some Days  Substance and Sexual Activity   Alcohol use: Yes    Alcohol/week: 2.0 standard drinks of alcohol    Types: 2 Standard drinks or equivalent per week    Comment: social   Drug use: No   Sexual activity: Yes    Birth control/protection: None  Other Topics Concern   Not on file  Social History Narrative   Not on file   Social Drivers of Health   Financial Resource Strain: Low Risk  (12/07/2024)   Overall Financial Resource Strain (CARDIA)    Difficulty of Paying Living Expenses: Not hard at all  Food Insecurity: No Food Insecurity (12/07/2024)   Hunger Vital Sign    Worried About Running Out of Food in the Last Year: Never true    Ran Out of Food in the Last Year:  Never true  Transportation Needs: No Transportation Needs (12/07/2024)   PRAPARE - Administrator, Civil Service (Medical): No    Lack of Transportation (Non-Medical): No  Physical Activity: Sufficiently Active (12/07/2024)   Exercise Vital Sign    Days of Exercise per Week: 6 days    Minutes of Exercise per Session: 60 min  Stress: Stress Concern Present (12/07/2024)   [employer] of Occupational Health - Occupational Stress Questionnaire    Feeling of Stress: Rather much  Social Connections: Moderately Isolated (12/07/2024)   Social Connection and Isolation Panel    Frequency of Communication with Friends and Family: More than three times a week    Frequency of Social Gatherings with Friends and Family: Patient declined    Attends Religious Services: Patient declined    Database Administrator or Organizations: No    Attends Engineer, Structural: Not on file    Marital Status: Married   Outpatient Medications Prior to Visit  Medication Sig   [DISCONTINUED] amLODipine  (NORVASC ) 5 MG tablet Take 1 tablet (5 mg total) by mouth daily.   [DISCONTINUED] atorvastatin  (LIPITOR) 20 MG tablet Take 1 tablet (20 mg total) by mouth daily.   [DISCONTINUED] hydrochlorothiazide  (HYDRODIURIL ) 12.5 MG tablet Take 1 tablet (12.5 mg total) by mouth daily.   [DISCONTINUED] losartan  (COZAAR ) 100 MG tablet Take 1 tablet (100 mg total) by mouth daily.   [DISCONTINUED] nicotine  (NICODERM CQ ) 21 mg/24hr patch Place 1 patch (21 mg total) onto the skin daily.   No facility-administered medications prior to visit.   No Known Allergies  Reviews of Systems as noted in HPI.      Objective    BP (!) 213/117 (BP Location: Left Arm, Patient Position: Sitting)   Pulse 85   Resp 16   Ht 5' 4 (1.626 m)   Wt 253 lb (114.8 kg)   SpO2 100%   BMI 43.43 kg/m     Physical Exam Constitutional:      Appearance: Normal appearance.  HENT:     Head: Normocephalic and atraumatic.      Mouth/Throat:     Mouth: Mucous membranes are moist.  Eyes:     Pupils: Pupils are equal, round, and reactive to light.  Cardiovascular:     Rate and Rhythm: Normal rate and regular rhythm.     Heart sounds: Normal heart sounds.  Pulmonary:     Effort: Pulmonary effort is normal.     Breath sounds: Normal breath sounds.  Skin:    General: Skin is warm.  Neurological:     General: No focal deficit present.  Mental Status: She is alert.     Depression Screen    10/02/2022    1:23 PM 11/14/2021    3:26 PM 10/03/2021   11:30 AM 09/19/2021    9:13 AM  PHQ 2/9 Scores  PHQ - 2 Score 0 2 0 0  PHQ- 9 Score 5  9        Data saved with a previous flowsheet row definition   No results found for any visits on 12/07/24.  Assessment & Plan      Problem List Items Addressed This Visit       Cardiovascular and Mediastinum   Hypertension - Primary   Relevant Medications   atorvastatin  (LIPITOR) 20 MG tablet   losartan -hydrochlorothiazide  (HYZAAR) 100-25 MG tablet   Other Relevant Orders   Comprehensive metabolic panel with GFR   Aldosterone + renin activity w/ ratio   Varicose veins of both lower extremities   Relevant Medications   atorvastatin  (LIPITOR) 20 MG tablet   losartan -hydrochlorothiazide  (HYZAAR) 100-25 MG tablet   Other Relevant Orders   Ambulatory referral to Vascular Surgery     Other   Nicotine  dependence, chewing tobacco, w unsp disorders   Relevant Medications   nicotine  (NICODERM CQ  - DOSED IN MG/24 HOURS) 14 mg/24hr patch   Pure hypertriglyceridemia   Relevant Medications   atorvastatin  (LIPITOR) 20 MG tablet   losartan -hydrochlorothiazide  (HYZAAR) 100-25 MG tablet   Other Relevant Orders   Lipid panel   Other Visit Diagnoses       Screening for diabetes mellitus (DM)       Relevant Orders   Hemoglobin A1c      Assessment & Plan Primary hypertension Hypertension uncontrolled due to being out of medication. Asymptomatic. Given BP very elevated  today, considered secondary causes of HTN, including hyperaldosteronism. Considered losartan  and HCTZ combination pill for adherence. - Ordered blood work for kidney, liver function, blood sugar, aldosterone levels. - Start Hyzaar 100-25mg  daily - Instructed home blood pressure monitoring and log maintenance. - Scheduled follow-up in two weeks.  Varicose veins of bilateral lower extremities with pain Varicose veins increasing in size and pain, especially around knees. Previous ultrasound confirmed varicose veins. - Referred to vein center for evaluation and management.  Pure hypertriglyceridemia Hypertriglyceridemia unmanaged. Previous cholesterol levels elevated. - Restart cholesterol medication. - Ordered lipid panel today  Nicotine  dependence Smoking half pack per day, reduced from full pack. Interested in nicotine  patches for cessation. - Prescribed 14 mg nicotine  patches.  General health maintenance Due for Pap smear. Declined flu vaccine. Discussed alcohol's impact on blood pressure. - Will schedule Pap smear at next appointment. - Encouraged alcohol reduction to one shot per day.     Return in about 2 weeks (around 12/21/2024) for Blood pressure follow up.      Isaiah DELENA Pepper, MD  [facility] [phone] (phone) [phone] (fax)

## 2024-12-08 ENCOUNTER — Ambulatory Visit: Payer: Self-pay

## 2024-12-12 LAB — COMPREHENSIVE METABOLIC PANEL WITH GFR
ALT: 20 IU/L (ref 0–32)
AST: 19 IU/L (ref 0–40)
Albumin: 4.5 g/dL (ref 3.9–4.9)
Alkaline Phosphatase: 75 IU/L (ref 41–116)
BUN/Creatinine Ratio: 20 (ref 9–23)
BUN: 13 mg/dL (ref 6–20)
Bilirubin Total: 0.7 mg/dL (ref 0.0–1.2)
CO2: 23 mmol/L (ref 20–29)
Calcium: 9.1 mg/dL (ref 8.7–10.2)
Chloride: 99 mmol/L (ref 96–106)
Creatinine, Ser: 0.66 mg/dL (ref 0.57–1.00)
Globulin, Total: 2.9 g/dL (ref 1.5–4.5)
Glucose: 87 mg/dL (ref 70–99)
Potassium: 4.2 mmol/L (ref 3.5–5.2)
Sodium: 137 mmol/L (ref 134–144)
Total Protein: 7.4 g/dL (ref 6.0–8.5)
eGFR: 115 mL/min/1.73 (ref >59)

## 2024-12-12 LAB — LIPID PANEL
Chol/HDL Ratio: 4.8 ratio — ABNORMAL HIGH (ref 0.0–4.4)
Cholesterol, Total: 295 mg/dL — ABNORMAL HIGH (ref 100–199)
HDL: 62 mg/dL (ref >39)
LDL Chol Calc (NIH): 208 mg/dL — ABNORMAL HIGH (ref 0–99)
Triglycerides: 138 mg/dL (ref 0–149)
VLDL Cholesterol Cal: 25 mg/dL (ref 5–40)

## 2024-12-12 LAB — ALDOSTERONE + RENIN ACTIVITY W/ RATIO
Aldos/Renin Ratio: <1.4 (ref 0.0–30.0)
Aldosterone: <1 ng/dL (ref 0.0–30.0)
Renin Activity, Plasma: 0.69 ng/mL/h (ref 0.167–5.380)

## 2024-12-12 LAB — HEMOGLOBIN A1C
Est. average glucose Bld gHb Est-mCnc: 108 mg/dL
Hgb A1c MFr Bld: 5.4 % (ref 4.8–5.6)

## 2024-12-21 ENCOUNTER — Ambulatory Visit

## 2024-12-21 VITALS — BP 157/96 | HR 103 | Temp 98.4°F | Wt 248.1 lb

## 2024-12-21 DIAGNOSIS — E782 Mixed hyperlipidemia: Secondary | ICD-10-CM | POA: Insufficient documentation

## 2024-12-21 DIAGNOSIS — I10 Essential (primary) hypertension: Secondary | ICD-10-CM

## 2024-12-21 MED ORDER — AMLODIPINE BESYLATE 5 MG PO TABS: 5.0000 mg | ORAL_TABLET | Freq: Every day | ORAL | 3 refills | Status: AC

## 2024-12-21 NOTE — Patient Instructions (Signed)
 [facility] [address] [place] [address] [phone]

## 2024-12-21 NOTE — Progress Notes (Signed)
 "     Established patient visit   Patient: Heidi Nichols   DOB: 12-02-1986   38 y.o. Female  MRN: [id number] Visit Date: 12/21/2024  Today's healthcare provider: Isaiah DELENA Pepper, MD   Chief Complaint  Patient presents with   Hypertension    Follow up Started new medication for the past 3 days has been fine     Subjective    HPI  Discussed the use of AI scribe software for clinical note transcription with the patient, who gave verbal consent to proceed.  History of Present Illness Heidi Nichols is a 38 year old female with hypertension who presents for follow-up of her blood pressure management.  She takes the medication as prescribed, except for the nicotine  patch, which she plans to start after the holidays. Her home blood pressure readings are approximately 150/98 mmHg during workdays and 132/80 mmHg on Sundays and Mondays when she is off work. She is adjusting the timing of her medication to earlier in the day.  She experienced muscle cramps and dizziness last Thursday and Friday, which she attributes to the new medication. She also had a runny nose and was achy, but these symptoms have resolved. She noted that removing her contact lenses alleviated a headache she experienced, which she rarely gets. No persistent dizziness, headaches, or muscle cramps have occurred since removing the contacts.  She has a history of taking a combination of blood pressure medications, including a water pill and amlodipine , which she tolerated well in the past. She recalls taking a 5 mg dose of amlodipine  previously without side effects.  Her father is involved in monitoring her blood pressure, requesting daily updates on her readings.  She is busy with work, especially during the holiday season, and plans to take a cruise with her family after Christmas.   Medications: Show/hide medication list[1]  Review of Systems as noted in HPI.      Objective    BP (!) 157/96    Pulse (!) 103   Temp 98.4 F (36.9 C) (Oral)   Wt 248 lb 1.6 oz (112.5 kg)   LMP 12/12/2024   SpO2 99%   BMI 42.59 kg/m    Vitals:   12/21/24 1307 12/21/24 1309  BP: (!) 157/101 (!) 157/96  Pulse: (!) 102 (!) 103  Temp: 98.4 F (36.9 C)   TempSrc: Oral   SpO2: 99%   Weight: 248 lb 1.6 oz (112.5 kg)     Physical Exam Constitutional:      Appearance: Normal appearance.  HENT:     Head: Normocephalic and atraumatic.     Mouth/Throat:     Mouth: Mucous membranes are moist.  Eyes:     Pupils: Pupils are equal, round, and reactive to light.  Pulmonary:     Effort: Pulmonary effort is normal.  Skin:    General: Skin is warm.  Neurological:     General: No focal deficit present.     Mental Status: She is alert.      No results found for any visits on 12/21/24.  Assessment & Plan     Problem List Items Addressed This Visit       Cardiovascular and Mediastinum   Hypertension - Primary   Relevant Medications   amLODipine  (NORVASC ) 5 MG tablet   Other Relevant Orders   Basic metabolic panel with GFR     Other   Mixed hyperlipidemia   Relevant Medications   amLODipine  (NORVASC ) 5 MG tablet  Assessment & Plan Primary hypertension Chronic, uncontrolled. Blood pressure improved since last visit but remains above goal. Restarted losartan -hydrochlorothiazide  100-25mg  at last visit. Normal aldosterone levels.  - Continue Hyzaar - Start amlodipine  5 mg daily. - Recheck electrolytes and kidney function. - Follow up in one month.  Hyperlipidemia Cholesterol medication restarted at last visit. Muscle cramps possibly related to medication, but have resolved.  - Continue Lipitor 20mg  - Recheck lipid panel at next visit  General health maintenance Discussion about scheduling a Pap smear. - Schedule Pap smear.    Return in about 4 weeks (around 01/18/2025) for Follow Up.       Isaiah DELENA Pepper, MD  [facility] [phone]  (phone) [phone] (fax)     [1]  Outpatient Medications Prior to Visit  Medication Sig   atorvastatin  (LIPITOR) 20 MG tablet Take 1 tablet (20 mg total) by mouth daily.   losartan -hydrochlorothiazide  (HYZAAR) 100-25 MG tablet Take 1 tablet by mouth daily.   nicotine  (NICODERM CQ  - DOSED IN MG/24 HOURS) 14 mg/24hr patch Place 1 patch (14 mg total) onto the skin daily. (Patient not taking: Reported on 12/21/2024)   No facility-administered medications prior to visit.   "

## 2024-12-22 ENCOUNTER — Ambulatory Visit: Payer: Self-pay

## 2024-12-22 LAB — BASIC METABOLIC PANEL WITH GFR
BUN/Creatinine Ratio: 24 — ABNORMAL HIGH (ref 9–23)
BUN: 19 mg/dL (ref 6–20)
CO2: 25 mmol/L (ref 20–29)
Calcium: 9.7 mg/dL (ref 8.7–10.2)
Chloride: 95 mmol/L — ABNORMAL LOW (ref 96–106)
Creatinine, Ser: 0.8 mg/dL (ref 0.57–1.00)
Glucose: 85 mg/dL (ref 70–99)
Potassium: 3.9 mmol/L (ref 3.5–5.2)
Sodium: 138 mmol/L (ref 134–144)
eGFR: 97 mL/min/1.73

## 2025-01-10 ENCOUNTER — Ambulatory Visit

## 2025-01-25 ENCOUNTER — Ambulatory Visit

## 2025-01-25 NOTE — Progress Notes (Unsigned)
" °  ° ° °  Established patient visit   Patient: Heidi Nichols   DOB: [birth year]   39 y.o. Female  MRN: [id number] Visit Date: 01/25/2025  Today's healthcare provider: Isaiah DELENA Pepper, MD   No chief complaint on file.  Subjective    HPI  Discussed the use of AI scribe software for clinical note transcription with the patient, who gave verbal consent to proceed.  History of Present Illness      Medications: Show/hide medication list[1]  Review of Systems as noted in HPI.  {Insert previous labs (optional):23779} {See past labs  Heme  Chem  Endocrine  Serology  Results Review (optional):1}   Objective    There were no vitals taken for this visit. {Insert last BP/Wt (optional):23777}{See vitals history (optional):1}  Physical Exam   No results found for any visits on 01/25/25.  Assessment & Plan     Problem List Items Addressed This Visit   None   Assessment and Plan Assessment & Plan       No follow-ups on file.       Isaiah DELENA Pepper, MD  [facility] [phone] (phone) [phone] (fax)    [1]  Outpatient Medications Prior to Visit  Medication Sig   amLODipine  (NORVASC ) 5 MG tablet Take 1 tablet (5 mg total) by mouth daily.   atorvastatin  (LIPITOR) 20 MG tablet Take 1 tablet (20 mg total) by mouth daily.   losartan -hydrochlorothiazide  (HYZAAR) 100-25 MG tablet Take 1 tablet by mouth daily.   nicotine  (NICODERM CQ  - DOSED IN MG/24 HOURS) 14 mg/24hr patch Place 1 patch (14 mg total) onto the skin daily. (Patient not taking: Reported on 12/21/2024)   No facility-administered medications prior to visit.   "

## 2025-02-23 ENCOUNTER — Ambulatory Visit
